# Patient Record
Sex: Female | Born: 1970
Health system: Southern US, Community
[De-identification: ages and names within clinical notes are randomized; demographics above are authoritative.]

## PROBLEM LIST (undated history)

## (undated) DIAGNOSIS — B019 Varicella without complication: Secondary | ICD-10-CM

## (undated) DIAGNOSIS — G43909 Migraine, unspecified, not intractable, without status migrainosus: Secondary | ICD-10-CM

## (undated) HISTORY — PX: AUGMENTATION MAMMAPLASTY: SUR837

## (undated) HISTORY — PX: MASTOPEXY: SUR857

## (undated) HISTORY — DX: Migraine, unspecified, not intractable, without status migrainosus: G43.909

## (undated) HISTORY — DX: Varicella without complication: B01.9

---
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
## 2003-07-21 HISTORY — PX: ABDOMINAL HYSTERECTOMY: SHX81

## 2006-07-22 LAB — HIV ANTIBODY (ROUTINE TESTING W REFLEX): HIV 1&2 Ab, 4th Generation: NEGATIVE

## 2015-01-24 ENCOUNTER — Encounter (INDEPENDENT_AMBULATORY_CARE_PROVIDER_SITE_OTHER): Payer: Self-pay

## 2015-01-24 ENCOUNTER — Encounter: Payer: Self-pay | Admitting: Internal Medicine

## 2015-01-24 ENCOUNTER — Ambulatory Visit (INDEPENDENT_AMBULATORY_CARE_PROVIDER_SITE_OTHER): Payer: BLUE CROSS/BLUE SHIELD | Admitting: Internal Medicine

## 2015-01-24 VITALS — BP 102/68 | HR 71 | Temp 98.1°F | Ht 65.25 in | Wt 134.0 lb

## 2015-01-24 DIAGNOSIS — G43019 Migraine without aura, intractable, without status migrainosus: Secondary | ICD-10-CM | POA: Diagnosis not present

## 2015-01-24 MED ORDER — TOPIRAMATE 50 MG PO TABS: 50.0000 mg | ORAL_TABLET | Freq: Two times a day (BID) | ORAL | Status: DC

## 2015-01-24 MED ORDER — RIZATRIPTAN BENZOATE 10 MG PO TABS: 10.0000 mg | ORAL_TABLET | Freq: Three times a day (TID) | ORAL | Status: DC | PRN

## 2015-01-24 NOTE — Progress Notes (Signed)
HPI  Pt presents to the clinic today to establish care and for management of the conditions listed below. She is transferring care from her PCP in [place]. She moved her 9 months ago.  Migraines: She may not have 1 per month but the next month she could have 3. She has determined that they are triggered by smells. They usually occur over her right eye, and the pain radiates to the back of her head.  She has sensitivity to light, sound and smell. She does have some nausea but denies vomiting. She goes and lays in a dark room which seems to help.  She takes Topomax twice daily and Maxalt as needed. She does think she needs to increase her Topomax as she feels like it is not effective as it once was. She will also take Ibuprofen and Tylenol if she feels a headache coming on. She reports she has had a MRI in the past. She will need refills of her medications today.  Past Medical History  Diagnosis Date  . Migraines   . Chicken pox     Current Outpatient Prescriptions  Medication Sig Dispense Refill  . acetaminophen (TYLENOL) 500 MG tablet Take 500 mg by mouth as needed for headache.    . ibuprofen (ADVIL,MOTRIN) 200 MG tablet Take 800 mg by mouth as needed for headache.     . Probiotic Product (PROBIOTIC ADVANCED) CAPS Take 1 capsule by mouth daily.    . rizatriptan (MAXALT) 10 MG tablet Take 10 mg by mouth 3 (three) times daily as needed for migraine.     . topiramate (TOPAMAX) 25 MG capsule Take 25 mg by mouth 2 (two) times daily.     No current facility-administered medications for this visit.    Allergies  Allergen Reactions  . Morphine And Related Other (See Comments)    Many years ago--does not remember     Family History  Problem Relation Age of Onset  . Hypertension Mother   . Arthritis Father   . Hypertension Maternal Uncle   . Hypertension Maternal Grandmother   . Hypertension Maternal Grandfather     History   Social History  . Marital Status: Married    Spouse  Name: N/A  . Number of Children: N/A  . Years of Education: N/A   Occupational History  . Not on file.   Social History Main Topics  . Smoking status: Current Some Day Smoker  . Smokeless tobacco: Never Used     Comment: quit cigarettes 2014--now using e cigs  . Alcohol Use: 0.0 oz/week    0 Standard drinks or equivalent per week     Comment: rare  . Drug Use: Not on file  . Sexual Activity: Not on file   Other Topics Concern  . Not on file   Social History Narrative  . No narrative on file    ROS:  Constitutional: Denies fever, malaise, fatigue, headache or abrupt weight changes.  HEENT: Denies eye pain, eye redness, ear pain, ringing in the ears, wax buildup, runny nose, nasal congestion, bloody nose, or sore throat. Respiratory: Denies difficulty breathing, shortness of breath, cough or sputum production.   Cardiovascular: Denies chest pain, chest tightness, palpitations or swelling in the hands or feet.  Gastrointestinal: Denies abdominal pain, bloating, constipation, diarrhea or blood in the stool.  GU: Denies frequency, urgency, pain with urination, blood in urine, odor or discharge. Musculoskeletal: Denies decrease in range of motion, difficulty with gait, muscle pain or joint pain and  swelling.  Skin: Denies redness, rashes, lesions or ulcercations.  Neurological: Denies dizziness, difficulty with memory, difficulty with speech or problems with balance and coordination.  Psych: Denies anxiety, depression, SI/HI.  No other specific complaints in a complete review of systems (except as listed in HPI above).  PE:  BP 102/68 mmHg  Pulse 71  Temp(Src) 98.1 F (36.7 C) (Oral)  Ht 5' 5.25" (1.657 m)  Wt 134 lb (60.782 kg)  BMI 22.14 kg/m2  SpO2 99% Wt Readings from Last 3 Encounters:  01/24/15 134 lb (60.782 kg)    General: Appears her stated age, well developed, well nourished in NAD. HEENT: Head: normal shape and size; Eyes: sclera white, no icterus,  conjunctiva pink, PERRLA and EOMs intact;  Cardiovascular: Normal rate and rhythm. S1,S2 noted.  No murmur, rubs or gallops noted.  Pulmonary/Chest: Normal effort and positive vesicular breath sounds. No respiratory distress. No wheezes, rales or ronchi noted.  Neurological: Alert and oriented.  Coordination normal.  Psychiatric: Mood and affect normal. Behavior is normal. Judgment and thought content normal.   Assessment and Plan:

## 2015-01-24 NOTE — Assessment & Plan Note (Signed)
Will increase Topamax to 50 mg BID- sedation caution given. New RX provided today Maxalt refilled Continue Tylenol and Ibuprofen as needed

## 2015-01-24 NOTE — Patient Instructions (Signed)

## 2015-01-24 NOTE — Progress Notes (Signed)
Pre visit review using our clinic review tool, if applicable. No additional management support is needed unless otherwise documented below in the visit note. 

## 2015-04-23 ENCOUNTER — Other Ambulatory Visit: Payer: Self-pay

## 2015-04-23 DIAGNOSIS — G43019 Migraine without aura, intractable, without status migrainosus: Secondary | ICD-10-CM

## 2015-04-23 MED ORDER — TOPIRAMATE 50 MG PO TABS: 50.0000 mg | ORAL_TABLET | Freq: Two times a day (BID) | ORAL | Status: DC

## 2015-05-31 ENCOUNTER — Encounter: Payer: Self-pay | Admitting: Internal Medicine

## 2015-05-31 ENCOUNTER — Ambulatory Visit (INDEPENDENT_AMBULATORY_CARE_PROVIDER_SITE_OTHER): Payer: BLUE CROSS/BLUE SHIELD | Admitting: Internal Medicine

## 2015-05-31 VITALS — BP 106/72 | HR 90 | Temp 98.9°F | Wt 129.5 lb

## 2015-05-31 DIAGNOSIS — G43019 Migraine without aura, intractable, without status migrainosus: Secondary | ICD-10-CM | POA: Diagnosis not present

## 2015-05-31 MED ORDER — TOPIRAMATE 50 MG PO TABS: 50.0000 mg | ORAL_TABLET | Freq: Two times a day (BID) | ORAL | Status: DC

## 2015-05-31 NOTE — Assessment & Plan Note (Signed)
Improved on Topamax 50 mg BID Takes Maxalt as needed Topamax refilled today  Make an appt in 6 months for annual exam

## 2015-05-31 NOTE — Patient Instructions (Signed)
Recurrent Migraine Headache A migraine headache is an intense, throbbing pain on one or both sides of your head. Recurrent migraines keep coming back. A migraine can last for 30 minutes to several hours. CAUSES  The exact cause of a migraine headache is not always known. However, a migraine may be caused when nerves in the brain become irritated and release chemicals that cause inflammation. This causes pain. Certain things may also trigger migraines, such as:   Alcohol.  Smoking.  Stress.  Menstruation.  Aged cheeses.  Foods or drinks that contain nitrates, glutamate, aspartame, or tyramine.  Lack of sleep.  Chocolate.  Caffeine.  Hunger.  Physical exertion.  Fatigue.  Medicines used to treat chest pain (nitroglycerine), birth control pills, estrogen, and some blood pressure medicines. SYMPTOMS   Pain on one or both sides of your head.  Pulsating or throbbing pain.  Severe pain that prevents daily activities.  Pain that is aggravated by any physical activity.  Nausea, vomiting, or both.  Dizziness.  Pain with exposure to bright lights, loud noises, or activity.  General sensitivity to bright lights, loud noises, or smells. Before you get a migraine, you may get warning signs that a migraine is coming (aura). An aura may include:  Seeing flashing lights.  Seeing bright spots, halos, or zigzag lines.  Having tunnel vision or blurred vision.  Having feelings of numbness or tingling.  Having trouble talking.  Having muscle weakness. DIAGNOSIS  A recurrent migraine headache is often diagnosed based on:  Symptoms.  Physical examination.  A CT scan or MRI of your head. These imaging tests cannot diagnose migraines but can help rule out other causes of headaches.  TREATMENT  Medicines may be given for pain and nausea. Medicines can also be given to help prevent recurrent migraines. HOME CARE INSTRUCTIONS  Only take over-the-counter or prescription  medicines for pain or discomfort as directed by your health care provider. The use of long-term narcotics is not recommended.  Lie down in a dark, quiet room when you have a migraine.  Keep a journal to find out what may trigger your migraine headaches. For example, write down:  What you eat and drink.  How much sleep you get.  Any change to your diet or medicines.  Limit alcohol consumption.  Quit smoking if you smoke.  Get 7-9 hours of sleep, or as recommended by your health care provider.  Limit stress.  Keep lights dim if bright lights bother you and make your migraines worse. SEEK MEDICAL CARE IF:   You do not get relief from the medicines given to you.  You have a recurrence of pain.  You have a fever. SEEK IMMEDIATE MEDICAL CARE IF:  Your migraine becomes severe.  You have a stiff neck.  You have loss of vision.  You have muscular weakness or loss of muscle control.  You start losing your balance or have trouble walking.  You feel faint or pass out.  You have severe symptoms that are different from your first symptoms. MAKE SURE YOU:   Understand these instructions.  Will watch your condition.  Will get help right away if you are not doing well or get worse.   This information is not intended to replace advice given to you by your health care provider. Make sure you discuss any questions you have with your health care provider.   Document Released: 03/31/2001 Document Revised: 07/27/2014 Document Reviewed: 03/13/2013 Elsevier Interactive Patient Education 2016 [employer]  

## 2015-05-31 NOTE — Progress Notes (Signed)
Pre visit review using our clinic review tool, if applicable. No additional management support is needed unless otherwise documented below in the visit note. 

## 2015-05-31 NOTE — Progress Notes (Signed)
HPI  Pt presents to the clinic today to followup migraines.  Migraines: She may not have 1 per month but the next month she could have 3. She has determined that they are triggered by smells. They usually occur over her right eye, and the pain radiates to the back of her head.  She has sensitivity to light, sound and smell. She does have some nausea but denies vomiting. She goes and lays in a dark room which seems to help.  We recently increased her Topomax to 50 mg twice daily and Maxalt as needed. She will also take Ibuprofen and Tylenol if she feels a headache coming on. She reports she has had a MRI in the past. She will need refills of her medications today.  Past Medical History  Diagnosis Date  . Migraines   . Chicken pox     Current Outpatient Prescriptions  Medication Sig Dispense Refill  . acetaminophen (TYLENOL) 500 MG tablet Take 500 mg by mouth as needed for headache.    . ibuprofen (ADVIL,MOTRIN) 200 MG tablet Take 800 mg by mouth as needed for headache.     . rizatriptan (MAXALT) 10 MG tablet Take 1 tablet (10 mg total) by mouth 3 (three) times daily as needed for migraine. 10 tablet 5  . topiramate (TOPAMAX) 50 MG tablet Take 1 tablet (50 mg total) by mouth 2 (two) times daily. 180 tablet 0  . Probiotic Product (PROBIOTIC ADVANCED) CAPS Take 1 capsule by mouth daily.     No current facility-administered medications for this visit.    Allergies  Allergen Reactions  . Morphine And Related Other (See Comments)    Many years ago--does not remember     Family History  Problem Relation Age of Onset  . Hypertension Mother   . Diabetes Mother   . Arthritis Father   . Hypertension Maternal Uncle   . Hypertension Maternal Grandmother   . Hypertension Maternal Grandfather   . Cancer Paternal Grandfather     Social History   Social History  . Marital Status: Married    Spouse Name: N/A  . Number of Children: N/A  . Years of Education: N/A   Occupational History   . Not on file.   Social History Main Topics  . Smoking status: Current Some Day Smoker  . Smokeless tobacco: Never Used     Comment: quit cigarettes 2014--now using e cigs  . Alcohol Use: 0.0 oz/week    0 Standard drinks or equivalent per week     Comment: rare  . Drug Use: Not on file  . Sexual Activity: Yes   Other Topics Concern  . Not on file   Social History Narrative    ROS:  Constitutional: Denies fever, malaise, fatigue, headache or abrupt weight changes.  HEENT: Denies eye pain, eye redness, ear pain, ringing in the ears, wax buildup, runny nose, nasal congestion, bloody nose, or sore throat. Respiratory: Denies difficulty breathing, shortness of breath, cough or sputum production.   Cardiovascular: Denies chest pain, chest tightness, palpitations or swelling in the hands or feet.  Gastrointestinal: Denies abdominal pain, bloating, constipation, diarrhea or blood in the stool.  GU: Denies frequency, urgency, pain with urination, blood in urine, odor or discharge. Musculoskeletal: Denies decrease in range of motion, difficulty with gait, muscle pain or joint pain and swelling.  Skin: Denies redness, rashes, lesions or ulcercations.  Neurological: Denies dizziness, difficulty with memory, difficulty with speech or problems with balance and coordination.  Psych: Denies anxiety,  depression, SI/HI.  No other specific complaints in a complete review of systems (except as listed in HPI above).  PE:  BP 106/72 mmHg  Pulse 90  Temp(Src) 98.9 F (37.2 C) (Oral)  Wt 129 lb 8 oz (58.741 kg)  SpO2 98% Wt Readings from Last 3 Encounters:  05/31/15 129 lb 8 oz (58.741 kg)  01/24/15 134 lb (60.782 kg)    General: Appears her stated age, well developed, well nourished in NAD. HEENT: Head: normal shape and size; Eyes: sclera white, no icterus, conjunctiva pink, PERRLA and EOMs intact;  Cardiovascular: Normal rate and rhythm. S1,S2 noted.  No murmur, rubs or gallops noted.   Pulmonary/Chest: Normal effort and positive vesicular breath sounds. No respiratory distress. No wheezes, rales or ronchi noted.  Neurological: Alert and oriented.  Coordination normal.  Psychiatric: Mood and affect normal. Behavior is normal. Judgment and thought content normal.   Assessment and Plan:

## 2015-07-23 ENCOUNTER — Ambulatory Visit (INDEPENDENT_AMBULATORY_CARE_PROVIDER_SITE_OTHER): Payer: BLUE CROSS/BLUE SHIELD | Admitting: Internal Medicine

## 2015-07-23 ENCOUNTER — Encounter: Payer: Self-pay | Admitting: Internal Medicine

## 2015-07-23 VITALS — BP 104/74 | HR 86 | Temp 98.4°F | Wt 128.0 lb

## 2015-07-23 DIAGNOSIS — J069 Acute upper respiratory infection, unspecified: Secondary | ICD-10-CM | POA: Diagnosis not present

## 2015-07-23 DIAGNOSIS — T3695XA Adverse effect of unspecified systemic antibiotic, initial encounter: Secondary | ICD-10-CM

## 2015-07-23 DIAGNOSIS — J029 Acute pharyngitis, unspecified: Secondary | ICD-10-CM

## 2015-07-23 DIAGNOSIS — B379 Candidiasis, unspecified: Secondary | ICD-10-CM

## 2015-07-23 MED ORDER — AMOXICILLIN 875 MG PO TABS: 875.0000 mg | ORAL_TABLET | Freq: Two times a day (BID) | ORAL | Status: DC

## 2015-07-23 MED ORDER — FLUCONAZOLE 150 MG PO TABS
150.0000 mg | ORAL_TABLET | Freq: Once | ORAL | Status: DC
Start: 2015-07-23 — End: 2015-10-30

## 2015-07-23 NOTE — Progress Notes (Signed)
HPI  Pt presents to the clinic today with c/o headache, nasal congestion, ear fullness and sore throat. This started 1 week ago. She is not blowing anything out of her nose. She did have difficulty swallowing in the beginning. She denies fever but has had chills and body aches. She has tried Mucinex, Dayquil, Sudafed and nasal Saline with minimal relief. She has no history of allergies or breathing problems. She has had sick contacts. She does not smoke.  Review of Systems    Past Medical History  Diagnosis Date  . Migraines   . Chicken pox     Family History  Problem Relation Age of Onset  . Hypertension Mother   . Diabetes Mother   . Arthritis Father   . Hypertension Maternal Uncle   . Hypertension Maternal Grandmother   . Hypertension Maternal Grandfather   . Cancer Paternal Grandfather     Social History   Social History  . Marital Status: Married    Spouse Name: N/A  . Number of Children: N/A  . Years of Education: N/A   Occupational History  . Not on file.   Social History Main Topics  . Smoking status: Current Some Day Smoker  . Smokeless tobacco: Never Used     Comment: quit cigarettes 2014--now using e cigs  . Alcohol Use: 0.0 oz/week    0 Standard drinks or equivalent per week     Comment: rare  . Drug Use: Not on file  . Sexual Activity: Yes   Other Topics Concern  . Not on file   Social History Narrative    Allergies  Allergen Reactions  . Morphine And Related Other (See Comments)    Many years ago--does not remember      Constitutional: Positive headache, fatigueDenies fever or abrupt weight changes.  HEENT:  Positive nasal congestion, ear fullness and sore throat. Denies eye redness, ear pain, ringing in the ears, wax buildup, runny nose or bloody nose. Respiratory: Denies cough, difficulty breathing or shortness of breath.  Cardiovascular: Denies chest pain, chest tightness, palpitations or swelling in the hands or feet.   No other  specific complaints in a complete review of systems (except as listed in HPI above).  Objective:  BP 104/74 mmHg  Pulse 86  Temp(Src) 98.4 F (36.9 C) (Oral)  Wt 128 lb (58.06 kg)  SpO2 98%  General: Appears her stated age, ill appearing in NAD. HEENT: Head: normal shape and size, no sinus tenderness noted; Eyes: sclera white, no icterus, conjunctiva pink; Ears: Tm's gray and intact, normal light reflex; Nose: mucosa boggy and moist, septum midline; Throat/Mouth: + PND. Teeth present, mucosa erythematous and moist, no exudate noted, no lesions or ulcerations noted.  Neck:  Cervical adenopathy noted R>L.  Cardiovascular: Normal rate and rhythm. S1,S2 noted.  No murmur, rubs or gallops noted.  Pulmonary/Chest: Normal effort and positive vesicular breath sounds. No respiratory distress. No wheezes, rales or ronchi noted.      Assessment & Plan:     Upper Respiratory Infection:  RST: negative Get some rest and drink plenty of water Do salt water gargles for the sore throat Ibuprofen as needed for body aches/sore throat eRx for Amoxil BID x 10 days eRx for Diflucan for antibiotic induced infection  RTC as needed or if symptoms persist.

## 2015-07-23 NOTE — Progress Notes (Signed)
Pre visit review using our clinic review tool, if applicable. No additional management support is needed unless otherwise documented below in the visit note. 

## 2015-07-23 NOTE — Patient Instructions (Signed)

## 2015-10-17 ENCOUNTER — Other Ambulatory Visit: Payer: Self-pay | Admitting: Internal Medicine

## 2015-10-30 ENCOUNTER — Ambulatory Visit (INDEPENDENT_AMBULATORY_CARE_PROVIDER_SITE_OTHER): Payer: BLUE CROSS/BLUE SHIELD | Admitting: Internal Medicine

## 2015-10-30 ENCOUNTER — Encounter: Payer: Self-pay | Admitting: Internal Medicine

## 2015-10-30 ENCOUNTER — Ambulatory Visit (INDEPENDENT_AMBULATORY_CARE_PROVIDER_SITE_OTHER)
Admission: RE | Admit: 2015-10-30 | Discharge: 2015-10-30 | Disposition: A | Discharge status: Home / Self Care | Payer: BLUE CROSS/BLUE SHIELD | Source: Ambulatory Visit | Attending: Internal Medicine | Admitting: Internal Medicine

## 2015-10-30 VITALS — BP 126/84 | HR 69 | Temp 98.4°F | Ht 64.75 in | Wt 131.2 lb

## 2015-10-30 DIAGNOSIS — M545 Low back pain, unspecified: Secondary | ICD-10-CM

## 2015-10-30 DIAGNOSIS — M47816 Spondylosis without myelopathy or radiculopathy, lumbar region: Secondary | ICD-10-CM | POA: Diagnosis not present

## 2015-10-30 DIAGNOSIS — G43019 Migraine without aura, intractable, without status migrainosus: Secondary | ICD-10-CM | POA: Diagnosis not present

## 2015-10-30 DIAGNOSIS — Z0000 Encounter for general adult medical examination without abnormal findings: Secondary | ICD-10-CM

## 2015-10-30 DIAGNOSIS — Z0001 Encounter for general adult medical examination with abnormal findings: Secondary | ICD-10-CM

## 2015-10-30 MED ORDER — TOPIRAMATE 50 MG PO TABS: ORAL_TABLET | ORAL | Status: DC

## 2015-10-30 MED ORDER — RIZATRIPTAN BENZOATE 10 MG PO TABS: 10.0000 mg | ORAL_TABLET | Freq: Three times a day (TID) | ORAL | Status: DC | PRN

## 2015-10-30 NOTE — Progress Notes (Signed)
Subjective:    Patient ID: Emily Dalton, female    DOB: 1970-08-29, 45 y.o.   MRN: [id number]  HPI  She presents to the clinic today for her annual exam.  Flu: never Tetanus: 2012 Pap Smear: ? 2015 Mammogram:  2012 Vision Screening: yearly Dentist: annually  Diet: She does eat meat. She consumes fruits and veggies a few days per week. She does consume some fried foods. She drinks mostly water and coffee.  Exercise: She is not exercising but she is renovating a house which is very physical for her.  She needs refills today of her Topamax and Maxalt. She feels like her migraines are well controlled with this regimen. She only gets a migraine once every 3 months.  She c/o low back pain just around her tailbone. This started 3 weeks ago. She moved wrong and felt something catch. She describes the pain as burning, tingling and numbness. The pain does not radiate. She does have some associated muscle tightness. She denies loss of bowel or bladder. She has tried Biofreeze with some relief.  Review of Systems      Past Medical History  Diagnosis Date  . Migraines   . Chicken pox     Current Outpatient Prescriptions  Medication Sig Dispense Refill  . acetaminophen (TYLENOL) 500 MG tablet Take 500 mg by mouth as needed for headache.    . ibuprofen (ADVIL,MOTRIN) 200 MG tablet Take 800 mg by mouth as needed for headache.     . rizatriptan (MAXALT) 10 MG tablet Take 1 tablet (10 mg total) by mouth 3 (three) times daily as needed for migraine. 10 tablet 5  . topiramate (TOPAMAX) 50 MG tablet TAKE 1 TABLET (50 MG TOTAL) BY MOUTH 2 (TWO) TIMES DAILY. 180 tablet 0   No current facility-administered medications for this visit.    Allergies  Allergen Reactions  . Morphine And Related Other (See Comments)    Many years ago--does not remember     Family History  Problem Relation Age of Onset  . Hypertension Mother   . Diabetes Mother   . Arthritis Father   . Hypertension Maternal  Uncle   . Hypertension Maternal Grandmother   . Hypertension Maternal Grandfather   . Cancer Paternal Grandfather     Social History   Social History  . Marital Status: Married    Spouse Name: N/A  . Number of Children: N/A  . Years of Education: N/A   Occupational History  . Not on file.   Social History Main Topics  . Smoking status: Current Some Day Smoker  . Smokeless tobacco: Never Used     Comment: quit cigarettes 2014--now using e cigs  . Alcohol Use: 0.0 oz/week    0 Standard drinks or equivalent per week     Comment: rare  . Drug Use: No  . Sexual Activity: Yes   Other Topics Concern  . Not on file   Social History Narrative     Constitutional: Denies fever, malaise, fatigue, headache or abrupt weight changes.  HEENT: Denies eye pain, eye redness, ear pain, ringing in the ears, wax buildup, runny nose, nasal congestion, bloody nose, or sore throat. Respiratory: Denies difficulty breathing, shortness of breath, cough or sputum production.   Cardiovascular: Denies chest pain, chest tightness, palpitations or swelling in the hands or feet.  Gastrointestinal: Denies abdominal pain, bloating, constipation, diarrhea or blood in the stool.  GU: Denies urgency, frequency, pain with urination, burning sensation, blood in urine, odor or  discharge. Musculoskeletal: Pt reports back pain. Denies decrease in range of motion, difficulty with gait, or joint swelling.  Skin: Denies redness, rashes, lesions or ulcercations.  Neurological: Denies dizziness, difficulty with memory, difficulty with speech or problems with balance and coordination.  Psych: Denies anxiety, depression, SI/HI.  No other specific complaints in a complete review of systems (except as listed in HPI above).  Objective:   Physical Exam   BP 126/84 mmHg  Pulse 69  Temp(Src) 98.4 F (36.9 C) (Oral)  Ht 5' 4.75" (1.645 m)  Wt 131 lb 4 oz (59.535 kg)  BMI 22.00 kg/m2  SpO2 99% Wt Readings from Last  3 Encounters:  10/30/15 131 lb 4 oz (59.535 kg)  07/23/15 128 lb (58.06 kg)  05/31/15 129 lb 8 oz (58.741 kg)    General: Appears her stated age, well developed, well nourished in NAD. Skin: Warm, dry and intact.  HEENT: Head: normal shape and size; Eyes: sclera white, no icterus, conjunctiva pink, PERRLA and EOMs intact; Ears: Tm's gray and intact, normal light reflex; Throat/Mouth: Teeth present, mucosa pink and moist, no exudate, lesions or ulcerations noted.  Neck:  Neck supple, trachea midline. No masses, lumps or thyromegaly present.  Cardiovascular: Normal rate and rhythm. S1,S2 noted.  No murmur, rubs or gallops noted. No JVD or BLE edema.  Pulmonary/Chest: Normal effort and positive vesicular breath sounds. No respiratory distress. No wheezes, rales or ronchi noted.  Abdomen: Soft and nontender. Normal bowel sounds. No distention or masses noted. Liver, spleen and kidneys non palpable. Musculoskeletal: Normal flexion, extension and rotation of the spine. Pain with palpation over the sacrum. Strength 5/5 BUE/BLE. No difficulty with gait.  Neurological: Alert and oriented. Cranial nerves II-XII grossly  intact. Coordination normal.  Psychiatric: Mood and affect normal. Behavior is normal. Judgment and thought content normal.        Assessment & Plan:   Preventative Health Maintenance:  She declines flu shot Tetanus UTD Pap Smear due 2020 Mammogram declined at this time Encouraged her to consume a balanced diet and start an exercise regimen Encouraged her to see an eye doctor and dentist at least annually Will check CBC, CMET, Lipid today  Migraines:  Refilled Topamax and Maxalt  Low back pain:  Likely strain Xray lumbar spine today Ibuprofen/Biofreeze as needed  RTC in 1 year, sooner if needed

## 2015-10-30 NOTE — Patient Instructions (Signed)
Health Maintenance, Female Adopting a healthy lifestyle and getting preventive care can go a long way to promote health and wellness. Talk with your health care provider about what schedule of regular examinations is right for you. This is a good chance for you to check in with your provider about disease prevention and staying healthy. In between checkups, there are plenty of things you can do on your own. Experts have done a lot of research about which lifestyle changes and preventive measures are most likely to keep you healthy. Ask your health care provider for more information. WEIGHT AND DIET  Eat a healthy diet  Be sure to include plenty of vegetables, fruits, low-fat dairy products, and lean protein.  Do not eat a lot of foods high in solid fats, added sugars, or salt.  Get regular exercise. This is one of the most important things you can do for your health.  Most adults should exercise for at least 150 minutes each week. The exercise should increase your heart rate and make you sweat (moderate-intensity exercise).  Most adults should also do strengthening exercises at least twice a week. This is in addition to the moderate-intensity exercise.  Maintain a healthy weight  Body mass index (BMI) is a measurement that can be used to identify possible weight problems. It estimates body fat based on height and weight. Your health care provider can help determine your BMI and help you achieve or maintain a healthy weight.  For females 20 years of age and older:   A BMI below 18.5 is considered underweight.  A BMI of 18.5 to 24.9 is normal.  A BMI of 25 to 29.9 is considered overweight.  A BMI of 30 and above is considered obese.  Watch levels of cholesterol and blood lipids  You should start having your blood tested for lipids and cholesterol at 45 years of age, then have this test every 5 years.  You may need to have your cholesterol levels checked more often if:  Your lipid  or cholesterol levels are high.  You are older than 45 years of age.  You are at high risk for heart disease.  CANCER SCREENING   Lung Cancer  Lung cancer screening is recommended for adults 55-80 years old who are at high risk for lung cancer because of a history of smoking.  A yearly low-dose CT scan of the lungs is recommended for people who:  Currently smoke.  Have quit within the past 15 years.  Have at least a 30-pack-year history of smoking. A pack year is smoking an average of one pack of cigarettes a day for 1 year.  Yearly screening should continue until it has been 15 years since you quit.  Yearly screening should stop if you develop a health problem that would prevent you from having lung cancer treatment.  Breast Cancer  Practice breast self-awareness. This means understanding how your breasts normally appear and feel.  It also means doing regular breast self-exams. Let your health care provider know about any changes, no matter how small.  If you are in your 20s or 30s, you should have a clinical breast exam (CBE) by a health care provider every 1-3 years as part of a regular health exam.  If you are 40 or older, have a CBE every year. Also consider having a breast X-ray (mammogram) every year.  If you have a family history of breast cancer, talk to your health care provider about genetic screening.  If you   are at high risk for breast cancer, talk to your health care provider about having an MRI and a mammogram every year.  Breast cancer gene (BRCA) assessment is recommended for women who have family members with BRCA-related cancers. BRCA-related cancers include:  Breast.  Ovarian.  Tubal.  Peritoneal cancers.  Results of the assessment will determine the need for genetic counseling and BRCA1 and BRCA2 testing. Cervical Cancer Your health care provider may recommend that you be screened regularly for cancer of the pelvic organs (ovaries, uterus, and  vagina). This screening involves a pelvic examination, including checking for microscopic changes to the surface of your cervix (Pap test). You may be encouraged to have this screening done every 3 years, beginning at age 21.  For women ages 30-65, health care providers may recommend pelvic exams and Pap testing every 3 years, or they may recommend the Pap and pelvic exam, combined with testing for human papilloma virus (HPV), every 5 years. Some types of HPV increase your risk of cervical cancer. Testing for HPV may also be done on women of any age with unclear Pap test results.  Other health care providers may not recommend any screening for nonpregnant women who are considered low risk for pelvic cancer and who do not have symptoms. Ask your health care provider if a screening pelvic exam is right for you.  If you have had past treatment for cervical cancer or a condition that could lead to cancer, you need Pap tests and screening for cancer for at least 20 years after your treatment. If Pap tests have been discontinued, your risk factors (such as having a new sexual partner) need to be reassessed to determine if screening should resume. Some women have medical problems that increase the chance of getting cervical cancer. In these cases, your health care provider may recommend more frequent screening and Pap tests. Colorectal Cancer  This type of cancer can be detected and often prevented.  Routine colorectal cancer screening usually begins at 45 years of age and continues through 45 years of age.  Your health care provider may recommend screening at an earlier age if you have risk factors for colon cancer.  Your health care provider may also recommend using home test kits to check for hidden blood in the stool.  A small camera at the end of a tube can be used to examine your colon directly (sigmoidoscopy or colonoscopy). This is done to check for the earliest forms of colorectal  cancer.  Routine screening usually begins at age 50.  Direct examination of the colon should be repeated every 5-10 years through 45 years of age. However, you may need to be screened more often if early forms of precancerous polyps or small growths are found. Skin Cancer  Check your skin from head to toe regularly.  Tell your health care provider about any new moles or changes in moles, especially if there is a change in a mole's shape or color.  Also tell your health care provider if you have a mole that is larger than the size of a pencil eraser.  Always use sunscreen. Apply sunscreen liberally and repeatedly throughout the day.  Protect yourself by wearing long sleeves, pants, a wide-brimmed hat, and sunglasses whenever you are outside. HEART DISEASE, DIABETES, AND HIGH BLOOD PRESSURE   High blood pressure causes heart disease and increases the risk of stroke. High blood pressure is more likely to develop in:  People who have blood pressure in the high end   of the normal range (130-139/85-89 mm Hg).  People who are overweight or obese.  People who are African American.  If you are 38-23 years of age, have your blood pressure checked every 3-5 years. If you are 61 years of age or older, have your blood pressure checked every year. You should have your blood pressure measured twice--once when you are at a hospital or clinic, and once when you are not at a hospital or clinic. Record the average of the two measurements. To check your blood pressure when you are not at a hospital or clinic, you can use:  An automated blood pressure machine at a pharmacy.  A home blood pressure monitor.  If you are between 45 years and 39 years old, ask your health care provider if you should take aspirin to prevent strokes.  Have regular diabetes screenings. This involves taking a blood sample to check your fasting blood sugar level.  If you are at a normal weight and have a low risk for diabetes,  have this test once every three years after 45 years of age.  If you are overweight and have a high risk for diabetes, consider being tested at a younger age or more often. PREVENTING INFECTION  Hepatitis B  If you have a higher risk for hepatitis B, you should be screened for this virus. You are considered at high risk for hepatitis B if:  You were born in a country where hepatitis B is common. Ask your health care provider which countries are considered high risk.  Your parents were born in a high-risk country, and you have not been immunized against hepatitis B (hepatitis B vaccine).  You have HIV or AIDS.  You use needles to inject street drugs.  You live with someone who has hepatitis B.  You have had sex with someone who has hepatitis B.  You get hemodialysis treatment.  You take certain medicines for conditions, including cancer, organ transplantation, and autoimmune conditions. Hepatitis C  Blood testing is recommended for:  Everyone born from [birth year] through 1965.  Anyone with known risk factors for hepatitis C. Sexually transmitted infections (STIs)  You should be screened for sexually transmitted infections (STIs) including gonorrhea and chlamydia if:  You are sexually active and are younger than 45 years of age.  You are older than 45 years of age and your health care provider tells you that you are at risk for this type of infection.  Your sexual activity has changed since you were last screened and you are at an increased risk for chlamydia or gonorrhea. Ask your health care provider if you are at risk.  If you do not have HIV, but are at risk, it may be recommended that you take a prescription medicine daily to prevent HIV infection. This is called pre-exposure prophylaxis (PrEP). You are considered at risk if:  You are sexually active and do not regularly use condoms or know the HIV status of your partner(s).  You take drugs by injection.  You are sexually  active with a partner who has HIV. Talk with your health care provider about whether you are at high risk of being infected with HIV. If you choose to begin PrEP, you should first be tested for HIV. You should then be tested every 3 months for as long as you are taking PrEP.  PREGNANCY   If you are premenopausal and you may become pregnant, ask your health care provider about preconception counseling.  If you may  become pregnant, take 400 to 800 micrograms (mcg) of folic acid every day.  If you want to prevent pregnancy, talk to your health care provider about birth control (contraception). OSTEOPOROSIS AND MENOPAUSE   Osteoporosis is a disease in which the bones lose minerals and strength with aging. This can result in serious bone fractures. Your risk for osteoporosis can be identified using a bone density scan.  If you are 61 years of age or older, or if you are at risk for osteoporosis and fractures, ask your health care provider if you should be screened.  Ask your health care provider whether you should take a calcium or vitamin D supplement to lower your risk for osteoporosis.  Menopause may have certain physical symptoms and risks.  Hormone replacement therapy may reduce some of these symptoms and risks. Talk to your health care provider about whether hormone replacement therapy is right for you.  HOME CARE INSTRUCTIONS   Schedule regular health, dental, and eye exams.  Stay current with your immunizations.   Do not use any tobacco products including cigarettes, chewing tobacco, or electronic cigarettes.  If you are pregnant, do not drink alcohol.  If you are breastfeeding, limit how much and how often you drink alcohol.  Limit alcohol intake to no more than 1 drink per day for nonpregnant women. One drink equals 12 ounces of beer, 5 ounces of wine, or 1 ounces of hard liquor.  Do not use street drugs.  Do not share needles.  Ask your health care provider for help if  you need support or information about quitting drugs.  Tell your health care provider if you often feel depressed.  Tell your health care provider if you have ever been abused or do not feel safe at home.   This information is not intended to replace advice given to you by your health care provider. Make sure you discuss any questions you have with your health care provider.   Document Released: 01/19/2011 Document Revised: 07/27/2014 Document Reviewed: 06/07/2013 Elsevier Interactive Patient Education [employer].

## 2015-10-30 NOTE — Progress Notes (Signed)
Pre visit review using our clinic review tool, if applicable. No additional management support is needed unless otherwise documented below in the visit note. 

## 2015-10-31 ENCOUNTER — Encounter: Payer: Self-pay | Admitting: *Deleted

## 2015-10-31 LAB — COMPREHENSIVE METABOLIC PANEL
ALK PHOS: 49 U/L (ref 39–117)
ALT: 14 U/L (ref 0–35)
AST: 14 U/L (ref 0–37)
Albumin: 4.4 g/dL (ref 3.5–5.2)
BUN: 10 mg/dL (ref 6–23)
CHLORIDE: 106 meq/L (ref 96–112)
CO2: 25 meq/L (ref 19–32)
Calcium: 9.4 mg/dL (ref 8.4–10.5)
Creatinine, Ser: 0.8 mg/dL (ref 0.40–1.20)
GFR: 82.4 mL/min (ref >60.00)
GLUCOSE: 85 mg/dL (ref 70–99)
POTASSIUM: 4.1 meq/L (ref 3.5–5.1)
SODIUM: 137 meq/L (ref 135–145)
TOTAL PROTEIN: 6.9 g/dL (ref 6.0–8.3)
Total Bilirubin: 0.2 mg/dL (ref 0.2–1.2)

## 2015-10-31 LAB — LIPID PANEL
CHOL/HDL RATIO: 3
Cholesterol: 213 mg/dL — ABNORMAL HIGH (ref 0–200)
HDL: 67.7 mg/dL (ref >39.00)
LDL CALC: 130 mg/dL — AB (ref 0–99)
NONHDL: 145.25
Triglycerides: 74 mg/dL (ref 0.0–149.0)
VLDL: 14.8 mg/dL (ref 0.0–40.0)

## 2015-10-31 LAB — CBC
HEMATOCRIT: 40.7 % (ref 36.0–46.0)
HEMOGLOBIN: 13.7 g/dL (ref 12.0–15.0)
MCHC: 33.7 g/dL (ref 30.0–36.0)
MCV: 91.5 fl (ref 78.0–100.0)
Platelets: 316 10*3/uL (ref 150.0–400.0)
RBC: 4.43 Mil/uL (ref 3.87–5.11)
RDW: 13.7 % (ref 11.5–15.5)
WBC: 6.6 10*3/uL (ref 4.0–10.5)

## 2015-11-04 ENCOUNTER — Telehealth: Payer: Self-pay | Admitting: Internal Medicine

## 2015-11-04 NOTE — Telephone Encounter (Signed)
Pt is returning all from last week. Please call back at [phone] Thanks

## 2015-11-04 NOTE — Telephone Encounter (Signed)
Patient returning call for xray results from 4/12.  Results given.

## 2016-08-19 ENCOUNTER — Other Ambulatory Visit: Payer: Self-pay | Admitting: Internal Medicine

## 2016-08-19 DIAGNOSIS — G43019 Migraine without aura, intractable, without status migrainosus: Secondary | ICD-10-CM

## 2016-08-19 NOTE — Telephone Encounter (Signed)
Last filled 10/2015 with 5 refills--please advise

## 2016-11-16 ENCOUNTER — Other Ambulatory Visit: Payer: Self-pay | Admitting: Internal Medicine

## 2016-12-07 ENCOUNTER — Ambulatory Visit (INDEPENDENT_AMBULATORY_CARE_PROVIDER_SITE_OTHER): Payer: BLUE CROSS/BLUE SHIELD | Admitting: Internal Medicine

## 2016-12-07 ENCOUNTER — Other Ambulatory Visit: Payer: Self-pay

## 2016-12-07 ENCOUNTER — Encounter: Payer: Self-pay | Admitting: Internal Medicine

## 2016-12-07 VITALS — BP 106/74 | HR 66 | Temp 98.0°F | Ht 64.75 in | Wt 134.8 lb

## 2016-12-07 DIAGNOSIS — G43019 Migraine without aura, intractable, without status migrainosus: Secondary | ICD-10-CM

## 2016-12-07 DIAGNOSIS — Z0000 Encounter for general adult medical examination without abnormal findings: Secondary | ICD-10-CM

## 2016-12-07 DIAGNOSIS — M7711 Lateral epicondylitis, right elbow: Secondary | ICD-10-CM | POA: Diagnosis not present

## 2016-12-07 DIAGNOSIS — Z1231 Encounter for screening mammogram for malignant neoplasm of breast: Secondary | ICD-10-CM

## 2016-12-07 DIAGNOSIS — Z0001 Encounter for general adult medical examination with abnormal findings: Secondary | ICD-10-CM

## 2016-12-07 DIAGNOSIS — Z1239 Encounter for other screening for malignant neoplasm of breast: Secondary | ICD-10-CM

## 2016-12-07 NOTE — Progress Notes (Signed)
Subjective:    Patient ID: Emily Dalton, female    DOB: [birth year], 46 y.o.   MRN: [id number]  HPI  Pt presents to the clinic today for her annual exam.   She does c/o right elbow pain. This started 3 months ago but has got worse . It seems worse with lifting. She has noticed decreased strength. She denies numbness or tingling in the right arm. She denies any injury to the area. She has tried wearing a tennis elbow brace which has not really helped. She has also tried ice and tried Tylenol without much relief.  Flu: never Tetanus: 10/2010 Mammogram: ? 2015 Pap Smear: ? 2015, partial hysterectomy Vision Screening: yearly Dentist: biannually  Diet: She does eat meat. She consumes some veggies, not a lot of fruit. She tries to avoid fried foods. She drinks mostly coffee and water. Exercise: none  Review of Systems      Past Medical History:  Diagnosis Date  . Chicken pox   . Migraines     Current Outpatient Prescriptions  Medication Sig Dispense Refill  . acetaminophen (TYLENOL) 500 MG tablet Take 500 mg by mouth as needed for headache.    . ibuprofen (ADVIL,MOTRIN) 200 MG tablet Take 800 mg by mouth as needed for headache.     . rizatriptan (MAXALT) 10 MG tablet TAKE 1 TABLET (10 MG TOTAL) BY MOUTH 3 (THREE) TIMES DAILY AS NEEDED FOR MIGRAINE. 10 tablet 4  . topiramate (TOPAMAX) 50 MG tablet Take 1 tablet (50 mg total) by mouth 2 (two) times daily. MUST SCHEDULE ANNUAL EXAM FOR FURTHER REFILLS 180 tablet 0   No current facility-administered medications for this visit.     Allergies  Allergen Reactions  . Morphine And Related Other (See Comments)    Many years ago--does not remember     Family History  Problem Relation Age of Onset  . Hypertension Mother   . Diabetes Mother   . Arthritis Father   . Hypertension Maternal Uncle   . Hypertension Maternal Grandmother   . Hypertension Maternal Grandfather   . Cancer Paternal Grandfather     Social History   Social  History  . Marital status: Married    Spouse name: N/A  . Number of children: N/A  . Years of education: N/A   Occupational History  . Not on file.   Social History Main Topics  . Smoking status: Current Some Day Smoker  . Smokeless tobacco: Never Used     Comment: quit cigarettes 2014--now using e cigs  . Alcohol use 0.0 oz/week     Comment: rare  . Drug use: No  . Sexual activity: Yes   Other Topics Concern  . Not on file   Social History Narrative  . No narrative on file     Constitutional: Denies fever, malaise, fatigue, headache or abrupt weight changes.  HEENT: Denies eye pain, eye redness, ear pain, ringing in the ears, wax buildup, runny nose, nasal congestion, bloody nose, or sore throat. Respiratory: Denies difficulty breathing, shortness of breath, cough or sputum production.   Cardiovascular: Denies chest pain, chest tightness, palpitations or swelling in the hands or feet.  Gastrointestinal: Denies abdominal pain, bloating, constipation, diarrhea or blood in the stool.  GU: Denies urgency, frequency, pain with urination, burning sensation, blood in urine, odor or discharge. Musculoskeletal: Pt reports right elbow pain. Denies decrease in range of motion, difficulty with gait, muscle pain or joint swelling.  Skin: Denies redness, rashes, lesions or ulcercations.  Neurological: Denies dizziness, difficulty with memory, difficulty with speech or problems with balance and coordination.  Psych: Denies anxiety, depression, SI/HI.  No other specific complaints in a complete review of systems (except as listed in HPI above).  Objective:   Physical Exam  BP 106/74   Pulse 66   Temp 98 F (36.7 C) (Oral)   Ht 5' 4.75" (1.645 m)   Wt 134 lb 12 oz (61.1 kg)   SpO2 99%   BMI 22.60 kg/m  Wt Readings from Last 3 Encounters:  12/07/16 134 lb 12 oz (61.1 kg)  10/30/15 131 lb 4 oz (59.5 kg)  07/23/15 128 lb (58.1 kg)    General: Appears her stated age, well  developed, well nourished in NAD. Skin: Warm, dry and intact.  HEENT: Head: normal shape and size; Eyes: sclera white, no icterus, conjunctiva pink, PERRLA and EOMs intact; Ears: Tm's gray and intact, normal light reflex; Throat/Mouth: Teeth present, mucosa pink and moist, no exudate, lesions or ulcerations noted.  Neck:  Neck supple, trachea midline. No masses, lumps or thyromegaly present.  Cardiovascular: Normal rate and rhythm. S1,S2 noted.  No murmur, rubs or gallops noted. No JVD or BLE edema.  Pulmonary/Chest: Normal effort and positive vesicular breath sounds. No respiratory distress. No wheezes, rales or ronchi noted.  Abdomen: Soft and nontender. Normal bowel sounds. No distention or masses noted. Liver, spleen and kidneys non palpable. Musculoskeletal: Normal flexion and extension of the right elbow. Pain with palpation adjacent to the lateral malleolus. No difficulty with gait.  Neurological: Alert and oriented. Cranial nerves II-XII grossly intact. Coordination normal.  Psychiatric: Mood and affect normal. Behavior is normal. Judgment and thought content normal.    BMET    Component Value Date/Time   NA 137 10/30/2015 1612   K 4.1 10/30/2015 1612   CL 106 10/30/2015 1612   CO2 25 10/30/2015 1612   GLUCOSE 85 10/30/2015 1612   BUN 10 10/30/2015 1612   CREATININE 0.80 10/30/2015 1612   CALCIUM 9.4 10/30/2015 1612    Lipid Panel     Component Value Date/Time   CHOL 213 (H) 10/30/2015 1612   TRIG 74.0 10/30/2015 1612   HDL 67.70 10/30/2015 1612   CHOLHDL 3 10/30/2015 1612   VLDL 14.8 10/30/2015 1612   LDLCALC 130 (H) 10/30/2015 1612    CBC    Component Value Date/Time   WBC 6.6 10/30/2015 1612   RBC 4.43 10/30/2015 1612   HGB 13.7 10/30/2015 1612   HCT 40.7 10/30/2015 1612   PLT 316.0 10/30/2015 1612   MCV 91.5 10/30/2015 1612   MCHC 33.7 10/30/2015 1612   RDW 13.7 10/30/2015 1612    Hgb A1C No results found for: HGBA1C          Assessment & Plan:    Preventative Health Maintenance:  Encouraged her to get a flu shot in the fall Tetanus UTD  Pelvic exam due 2020 Mammogram ordered- she will call [facility] to schedule- number provided Encouraged her to consume a balanced diet and exercise regimen Advised her to see an eye doctor and dentist annually Will check CBC, CMET and Lipid profile today  Lateral Epicondylitis:  eRx for Pred Taper x 9 days Continue to wear breast Rest it as much as possible  RTC in 1 year, sooner if needed Webb Silversmith, NP

## 2016-12-07 NOTE — Patient Instructions (Signed)
Health Maintenance, Female Adopting a healthy lifestyle and getting preventive care can go a long way to promote health and wellness. Talk with your health care provider about what schedule of regular examinations is right for you. This is a good chance for you to check in with your provider about disease prevention and staying healthy. In between checkups, there are plenty of things you can do on your own. Experts have done a lot of research about which lifestyle changes and preventive measures are most likely to keep you healthy. Ask your health care provider for more information. Weight and diet Eat a healthy diet  Be sure to include plenty of vegetables, fruits, low-fat dairy products, and lean protein.  Do not eat a lot of foods high in solid fats, added sugars, or salt.  Get regular exercise. This is one of the most important things you can do for your health.  Most adults should exercise for at least 150 minutes each week. The exercise should increase your heart rate and make you sweat (moderate-intensity exercise).  Most adults should also do strengthening exercises at least twice a week. This is in addition to the moderate-intensity exercise. Maintain a healthy weight  Body mass index (BMI) is a measurement that can be used to identify possible weight problems. It estimates body fat based on height and weight. Your health care provider can help determine your BMI and help you achieve or maintain a healthy weight.  For females 76 years of age and older:  A BMI below 18.5 is considered underweight.  A BMI of 18.5 to 24.9 is normal.  A BMI of 25 to 29.9 is considered overweight.  A BMI of 30 and above is considered obese. Watch levels of cholesterol and blood lipids  You should start having your blood tested for lipids and cholesterol at 46 years of age, then have this test every 5 years.  You may need to have your cholesterol levels checked more often if:  Your lipid or  cholesterol levels are high.  You are older than 46 years of age.  You are at high risk for heart disease. Cancer screening Lung Cancer  Lung cancer screening is recommended for adults 64-42 years old who are at high risk for lung cancer because of a history of smoking.  A yearly low-dose CT scan of the lungs is recommended for people who:  Currently smoke.  Have quit within the past 15 years.  Have at least a 30-pack-year history of smoking. A pack year is smoking an average of one pack of cigarettes a day for 1 year.  Yearly screening should continue until it has been 15 years since you quit.  Yearly screening should stop if you develop a health problem that would prevent you from having lung cancer treatment. Breast Cancer  Practice breast self-awareness. This means understanding how your breasts normally appear and feel.  It also means doing regular breast self-exams. Let your health care provider know about any changes, no matter how small.  If you are in your 20s or 30s, you should have a clinical breast exam (CBE) by a health care provider every 1-3 years as part of a regular health exam.  If you are 34 or older, have a CBE every year. Also consider having a breast X-ray (mammogram) every year.  If you have a family history of breast cancer, talk to your health care provider about genetic screening.  If you are at high risk for breast cancer, talk  to your health care provider about having an MRI and a mammogram every year.  Breast cancer gene (BRCA) assessment is recommended for women who have family members with BRCA-related cancers. BRCA-related cancers include:  Breast.  Ovarian.  Tubal.  Peritoneal cancers.  Results of the assessment will determine the need for genetic counseling and BRCA1 and BRCA2 testing. Cervical Cancer  Your health care provider may recommend that you be screened regularly for cancer of the pelvic organs (ovaries, uterus, and vagina).  This screening involves a pelvic examination, including checking for microscopic changes to the surface of your cervix (Pap test). You may be encouraged to have this screening done every 3 years, beginning at age 24.  For women ages 66-65, health care providers may recommend pelvic exams and Pap testing every 3 years, or they may recommend the Pap and pelvic exam, combined with testing for human papilloma virus (HPV), every 5 years. Some types of HPV increase your risk of cervical cancer. Testing for HPV may also be done on women of any age with unclear Pap test results.  Other health care providers may not recommend any screening for nonpregnant women who are considered low risk for pelvic cancer and who do not have symptoms. Ask your health care provider if a screening pelvic exam is right for you.  If you have had past treatment for cervical cancer or a condition that could lead to cancer, you need Pap tests and screening for cancer for at least 20 years after your treatment. If Pap tests have been discontinued, your risk factors (such as having a new sexual partner) need to be reassessed to determine if screening should resume. Some women have medical problems that increase the chance of getting cervical cancer. In these cases, your health care provider may recommend more frequent screening and Pap tests. Colorectal Cancer  This type of cancer can be detected and often prevented.  Routine colorectal cancer screening usually begins at 46 years of age and continues through 46 years of age.  Your health care provider may recommend screening at an earlier age if you have risk factors for colon cancer.  Your health care provider may also recommend using home test kits to check for hidden blood in the stool.  A small camera at the end of a tube can be used to examine your colon directly (sigmoidoscopy or colonoscopy). This is done to check for the earliest forms of colorectal cancer.  Routine  screening usually begins at age 41.  Direct examination of the colon should be repeated every 5-10 years through 46 years of age. However, you may need to be screened more often if early forms of precancerous polyps or small growths are found. Skin Cancer  Check your skin from head to toe regularly.  Tell your health care provider about any new moles or changes in moles, especially if there is a change in a mole's shape or color.  Also tell your health care provider if you have a mole that is larger than the size of a pencil eraser.  Always use sunscreen. Apply sunscreen liberally and repeatedly throughout the day.  Protect yourself by wearing long sleeves, pants, a wide-brimmed hat, and sunglasses whenever you are outside. Heart disease, diabetes, and high blood pressure  High blood pressure causes heart disease and increases the risk of stroke. High blood pressure is more likely to develop in:  People who have blood pressure in the high end of the normal range (130-139/85-89 mm Hg).  People who are overweight or obese.  People who are African American.  If you are 59-24 years of age, have your blood pressure checked every 3-5 years. If you are 34 years of age or older, have your blood pressure checked every year. You should have your blood pressure measured twice-once when you are at a hospital or clinic, and once when you are not at a hospital or clinic. Record the average of the two measurements. To check your blood pressure when you are not at a hospital or clinic, you can use:  An automated blood pressure machine at a pharmacy.  A home blood pressure monitor.  If you are between 29 years and 60 years old, ask your health care provider if you should take aspirin to prevent strokes.  Have regular diabetes screenings. This involves taking a blood sample to check your fasting blood sugar level.  If you are at a normal weight and have a low risk for diabetes, have this test once  every three years after 46 years of age.  If you are overweight and have a high risk for diabetes, consider being tested at a younger age or more often. Preventing infection Hepatitis B  If you have a higher risk for hepatitis B, you should be screened for this virus. You are considered at high risk for hepatitis B if:  You were born in a country where hepatitis B is common. Ask your health care provider which countries are considered high risk.  Your parents were born in a high-risk country, and you have not been immunized against hepatitis B (hepatitis B vaccine).  You have HIV or AIDS.  You use needles to inject street drugs.  You live with someone who has hepatitis B.  You have had sex with someone who has hepatitis B.  You get hemodialysis treatment.  You take certain medicines for conditions, including cancer, organ transplantation, and autoimmune conditions. Hepatitis C  Blood testing is recommended for:  Everyone born from [birth year] through 1965.  Anyone with known risk factors for hepatitis C. Sexually transmitted infections (STIs)  You should be screened for sexually transmitted infections (STIs) including gonorrhea and chlamydia if:  You are sexually active and are younger than 46 years of age.  You are older than 46 years of age and your health care provider tells you that you are at risk for this type of infection.  Your sexual activity has changed since you were last screened and you are at an increased risk for chlamydia or gonorrhea. Ask your health care provider if you are at risk.  If you do not have HIV, but are at risk, it may be recommended that you take a prescription medicine daily to prevent HIV infection. This is called pre-exposure prophylaxis (PrEP). You are considered at risk if:  You are sexually active and do not regularly use condoms or know the HIV status of your partner(s).  You take drugs by injection.  You are sexually active with a partner  who has HIV. Talk with your health care provider about whether you are at high risk of being infected with HIV. If you choose to begin PrEP, you should first be tested for HIV. You should then be tested every 3 months for as long as you are taking PrEP. Pregnancy  If you are premenopausal and you may become pregnant, ask your health care provider about preconception counseling.  If you may become pregnant, take 400 to 800 micrograms (mcg) of folic acid  every day.  If you want to prevent pregnancy, talk to your health care provider about birth control (contraception). Osteoporosis and menopause  Osteoporosis is a disease in which the bones lose minerals and strength with aging. This can result in serious bone fractures. Your risk for osteoporosis can be identified using a bone density scan.  If you are 4 years of age or older, or if you are at risk for osteoporosis and fractures, ask your health care provider if you should be screened.  Ask your health care provider whether you should take a calcium or vitamin D supplement to lower your risk for osteoporosis.  Menopause may have certain physical symptoms and risks.  Hormone replacement therapy may reduce some of these symptoms and risks. Talk to your health care provider about whether hormone replacement therapy is right for you. Follow these instructions at home:  Schedule regular health, dental, and eye exams.  Stay current with your immunizations.  Do not use any tobacco products including cigarettes, chewing tobacco, or electronic cigarettes.  If you are pregnant, do not drink alcohol.  If you are breastfeeding, limit how much and how often you drink alcohol.  Limit alcohol intake to no more than 1 drink per day for nonpregnant women. One drink equals 12 ounces of beer, 5 ounces of wine, or 1 ounces of hard liquor.  Do not use street drugs.  Do not share needles.  Ask your health care provider for help if you need support  or information about quitting drugs.  Tell your health care provider if you often feel depressed.  Tell your health care provider if you have ever been abused or do not feel safe at home. This information is not intended to replace advice given to you by your health care provider. Make sure you discuss any questions you have with your health care provider. Document Released: 01/19/2011 Document Revised: 12/12/2015 Document Reviewed: 04/09/2015 Elsevier Interactive Patient Education  2017 [employer].

## 2016-12-07 NOTE — Telephone Encounter (Signed)
Pt was seen earlier today for annual exam; pt forgot to request 3 month refill for topiramate and rizatriptan. Please advise. [facility] [address]

## 2016-12-08 ENCOUNTER — Telehealth: Payer: Self-pay

## 2016-12-08 LAB — LIPID PANEL
CHOL/HDL RATIO: 3
Cholesterol: 189 mg/dL (ref 0–200)
HDL: 63.2 mg/dL (ref >39.00)
LDL CALC: 113 mg/dL — AB (ref 0–99)
NONHDL: 126.08
TRIGLYCERIDES: 66 mg/dL (ref 0.0–149.0)
VLDL: 13.2 mg/dL (ref 0.0–40.0)

## 2016-12-08 LAB — CBC
HCT: 41.2 % (ref 36.0–46.0)
Hemoglobin: 13.5 g/dL (ref 12.0–15.0)
MCHC: 32.7 g/dL (ref 30.0–36.0)
MCV: 93.9 fl (ref 78.0–100.0)
Platelets: 324 10*3/uL (ref 150.0–400.0)
RBC: 4.38 Mil/uL (ref 3.87–5.11)
RDW: 14.7 % (ref 11.5–15.5)
WBC: 6.8 10*3/uL (ref 4.0–10.5)

## 2016-12-08 LAB — COMPREHENSIVE METABOLIC PANEL
ALT: 13 U/L (ref 0–35)
AST: 13 U/L (ref 0–37)
Albumin: 4.3 g/dL (ref 3.5–5.2)
Alkaline Phosphatase: 50 U/L (ref 39–117)
BILIRUBIN TOTAL: 0.2 mg/dL (ref 0.2–1.2)
BUN: 11 mg/dL (ref 6–23)
CALCIUM: 9.4 mg/dL (ref 8.4–10.5)
CHLORIDE: 105 meq/L (ref 96–112)
CO2: 24 meq/L (ref 19–32)
CREATININE: 0.85 mg/dL (ref 0.40–1.20)
GFR: 76.45 mL/min (ref >60.00)
Glucose, Bld: 71 mg/dL (ref 70–99)
Potassium: 4.3 mEq/L (ref 3.5–5.1)
Sodium: 138 mEq/L (ref 135–145)
Total Protein: 6.6 g/dL (ref 6.0–8.3)

## 2016-12-08 MED ORDER — PREDNISONE 10 MG PO TABS: ORAL_TABLET | ORAL | 0 refills | Status: DC

## 2016-12-08 MED ORDER — TOPIRAMATE 50 MG PO TABS: 50.0000 mg | ORAL_TABLET | Freq: Two times a day (BID) | ORAL | 0 refills | Status: DC

## 2016-12-08 MED ORDER — RIZATRIPTAN BENZOATE 10 MG PO TABS: 10.0000 mg | ORAL_TABLET | Freq: Three times a day (TID) | ORAL | 4 refills | Status: DC | PRN

## 2016-12-08 NOTE — Telephone Encounter (Signed)
Medication sent to pharmacy  

## 2016-12-08 NOTE — Addendum Note (Signed)
Addended by: Jearld Fenton on: 12/08/2016 10:13 AM   Modules accepted: Orders

## 2016-12-08 NOTE — Telephone Encounter (Signed)
Pt was seen 12/07/16 and thought a steroid was going to be sent to [facility] for elbow problem; pt request cb when med sent to pharmacy. 12/07/16 note has Erx pred taper x 9 days. Not sure of directions per day.Please advise.

## 2016-12-23 ENCOUNTER — Encounter: Payer: BLUE CROSS/BLUE SHIELD | Admitting: Internal Medicine

## 2017-04-23 ENCOUNTER — Ambulatory Visit (INDEPENDENT_AMBULATORY_CARE_PROVIDER_SITE_OTHER): Payer: BLUE CROSS/BLUE SHIELD | Admitting: Family Medicine

## 2017-04-23 ENCOUNTER — Encounter: Payer: Self-pay | Admitting: Internal Medicine

## 2017-04-23 ENCOUNTER — Encounter: Payer: Self-pay | Admitting: Family Medicine

## 2017-04-23 VITALS — BP 112/62 | HR 95 | Temp 98.1°F | Wt 136.0 lb

## 2017-04-23 DIAGNOSIS — J22 Unspecified acute lower respiratory infection: Secondary | ICD-10-CM | POA: Diagnosis not present

## 2017-04-23 MED ORDER — AZITHROMYCIN 250 MG PO TABS: ORAL_TABLET | ORAL | 0 refills | Status: DC

## 2017-04-23 NOTE — Progress Notes (Signed)
BP 112/62 (BP Location: Left Arm, Patient Position: Sitting, Cuff Size: Normal)   Pulse 95   Temp 98.1 F (36.7 C) (Oral)   Wt 136 lb (61.7 kg)   SpO2 99%   BMI 22.81 kg/m    CC: congestion Subjective:    Patient ID: Ronnald Ramp, female    DOB: January 23, 1971, 46 y.o.   MRN: [id number]  HPI: Ameriah Lint is a 46 y.o. female presenting on 04/23/2017 for Cough (Non-productive. Started several wks ago. Has taken OTC mucous caps, helfpul); Chest Pain (burning in upper, center chest); and Sore Throat   2+ week h/o chest > head congestion and chest pressure, sore throat with some PNdrainage, progressive dry cough with coughing fits, hoarse voice. Has felt feverish.   No fevers/chills.   Has tried generic mucous relief pills which helped her expectorate mucous. Some day smoker - e-cig.  No sick contacts at home.   Relevant past medical, surgical, family and social history reviewed and updated as indicated. Interim medical history since our last visit reviewed. Allergies and medications reviewed and updated. Outpatient Medications Prior to Visit  Medication Sig Dispense Refill  . acetaminophen (TYLENOL) 500 MG tablet Take 500 mg by mouth as needed for headache.    . ibuprofen (ADVIL,MOTRIN) 200 MG tablet Take 800 mg by mouth as needed for headache.     Marland Kitchen OVER THE COUNTER MEDICATION Take 1 each by mouth 2 (two) times daily. Cumin black seed oil    . rizatriptan (MAXALT) 10 MG tablet Take 1 tablet (10 mg total) by mouth 3 (three) times daily as needed for migraine. 10 tablet 4  . topiramate (TOPAMAX) 50 MG tablet Take 1 tablet (50 mg total) by mouth 2 (two) times daily. 180 tablet 0  . predniSONE (DELTASONE) 10 MG tablet Take 3 tabs on days 1-3, take 2 tabs on days 4-6, take 1 tab on days 7-9 18 tablet 0   No facility-administered medications prior to visit.      Per HPI unless specifically indicated in ROS section below Review of Systems     Objective:    BP 112/62 (BP Location:  Left Arm, Patient Position: Sitting, Cuff Size: Normal)   Pulse 95   Temp 98.1 F (36.7 C) (Oral)   Wt 136 lb (61.7 kg)   SpO2 99%   BMI 22.81 kg/m   Wt Readings from Last 3 Encounters:  04/23/17 136 lb (61.7 kg)  12/07/16 134 lb 12 oz (61.1 kg)  10/30/15 131 lb 4 oz (59.5 kg)    Physical Exam  Constitutional: She appears well-developed and well-nourished. No distress.  HENT:  Head: Normocephalic and atraumatic.  Right Ear: Hearing, tympanic membrane, external ear and ear canal normal.  Left Ear: Hearing, tympanic membrane, external ear and ear canal normal.  Nose: No mucosal edema or rhinorrhea. Right sinus exhibits no maxillary sinus tenderness and no frontal sinus tenderness. Left sinus exhibits no maxillary sinus tenderness and no frontal sinus tenderness.  Mouth/Throat: Uvula is midline and mucous membranes are normal. Posterior oropharyngeal erythema present. No oropharyngeal exudate, posterior oropharyngeal edema or tonsillar abscesses.  Mild nasal mucosal erythema  Eyes: Pupils are equal, round, and reactive to light. Conjunctivae and EOM are normal. No scleral icterus.  Neck: Normal range of motion. Neck supple.  Cardiovascular: Normal rate, regular rhythm, normal heart sounds and intact distal pulses.   No murmur heard. Pulmonary/Chest: Effort normal and breath sounds normal. No respiratory distress. She has no wheezes. She has no rales.  Lymphadenopathy:    She has no cervical adenopathy.  Skin: Skin is warm and dry. No rash noted.  Nursing note and vitals reviewed.     Assessment & Plan:   Problem List Items Addressed This Visit    Acute respiratory infection - Primary    Anticipate viral bronchitis given duration and improvement over last 24 hours. Reviewed with patient. rec ibuprofen 400-600mg  with meals x 3-5 days, continue expectorant. Reviewed anticipated course of improvement.  WASP for zpack provided today with discussion on when to fill - in case any  worsening symptoms. Pt agrees with plan.       Relevant Medications   azithromycin (ZITHROMAX) 250 MG tablet       Follow up plan: Return if symptoms worsen or fail to improve.  Ria Bush, MD

## 2017-04-23 NOTE — Patient Instructions (Signed)
I do think you have bronchitis, possibly viral as getting better. Push fluids, continue mucous relief medicine, take ibuprofen 400-600mg  with meals for next 3-5 days.  If fever >101, worsening productive cough, or any worsening, fill antibiotic provided today (zpack).

## 2017-04-23 NOTE — Assessment & Plan Note (Addendum)
Anticipate viral bronchitis given duration and improvement over last 24 hours. Reviewed with patient. rec ibuprofen 400-600mg  with meals x 3-5 days, continue expectorant. Reviewed anticipated course of improvement.  WASP for zpack provided today with discussion on when to fill - in case any worsening symptoms. Pt agrees with plan.

## 2017-04-26 ENCOUNTER — Ambulatory Visit: Payer: Self-pay | Admitting: Internal Medicine

## 2017-05-16 ENCOUNTER — Other Ambulatory Visit: Payer: Self-pay | Admitting: Internal Medicine

## 2017-05-17 NOTE — Telephone Encounter (Signed)
Baity pt, last filled 12/08/2016 90 days supply... Last OV CPE 11/2016... Please advise, send back to me if neccessary

## 2017-05-21 ENCOUNTER — Other Ambulatory Visit: Payer: Self-pay | Admitting: Internal Medicine

## 2017-07-26 ENCOUNTER — Ambulatory Visit (INDEPENDENT_AMBULATORY_CARE_PROVIDER_SITE_OTHER): Payer: BLUE CROSS/BLUE SHIELD | Admitting: Internal Medicine

## 2017-07-26 ENCOUNTER — Encounter: Payer: Self-pay | Admitting: Internal Medicine

## 2017-07-26 VITALS — BP 108/64 | HR 73 | Temp 98.0°F | Wt 146.8 lb

## 2017-07-26 DIAGNOSIS — R3915 Urgency of urination: Secondary | ICD-10-CM | POA: Diagnosis not present

## 2017-07-26 DIAGNOSIS — R351 Nocturia: Secondary | ICD-10-CM | POA: Diagnosis not present

## 2017-07-26 DIAGNOSIS — R350 Frequency of micturition: Secondary | ICD-10-CM

## 2017-07-26 LAB — POCT URINALYSIS DIPSTICK
Bilirubin, UA: NEGATIVE
Glucose, UA: NEGATIVE
Ketones, UA: NEGATIVE
LEUKOCYTES UA: NEGATIVE
NITRITE UA: NEGATIVE
PROTEIN UA: NEGATIVE
RBC UA: NEGATIVE
SPEC GRAV UA: 1.015 (ref 1.010–1.025)
Urobilinogen, UA: 0.2 E.U./dL
pH, UA: 8 (ref 5.0–8.0)

## 2017-07-26 MED ORDER — RIZATRIPTAN BENZOATE 10 MG PO TABS: 10.0000 mg | ORAL_TABLET | Freq: Three times a day (TID) | ORAL | 4 refills | Status: DC | PRN

## 2017-07-26 NOTE — Addendum Note (Signed)
Addended by: Emelia Salisbury C on: 07/26/2017 03:51 PM   Modules accepted: Orders

## 2017-07-26 NOTE — Progress Notes (Signed)
HPI  Pt presents to the clinic today with c/o urinary urgency, frequency and nocturia. This started about 5 days ago. She denies dysuria, blood in her urine or vaginal complaints. She denies fever, chills or nausea but has had some low back pain. She has not tried anything OTC for her symptoms. She does not drink a lot of fluids. She has no history of kidney stones.    Review of Systems  Past Medical History:  Diagnosis Date  . Chicken pox   . Migraines     Family History  Problem Relation Age of Onset  . Hypertension Mother   . Diabetes Mother   . Arthritis Father   . Hypertension Maternal Uncle   . Hypertension Maternal Grandmother   . Hypertension Maternal Grandfather   . Cancer Paternal Grandfather     Social History   Socioeconomic History  . Marital status: Married    Spouse name: Not on file  . Number of children: Not on file  . Years of education: Not on file  . Highest education level: Not on file  Social Needs  . Financial resource strain: Not on file  . Food insecurity - worry: Not on file  . Food insecurity - inability: Not on file  . Transportation needs - medical: Not on file  . Transportation needs - non-medical: Not on file  Occupational History  . Not on file  Tobacco Use  . Smoking status: Current Some Day Smoker    Types: E-cigarettes  . Smokeless tobacco: Never Used  . Tobacco comment: quit cigarettes 2014--now using e cigs  Substance and Sexual Activity  . Alcohol use: Yes    Alcohol/week: 0.0 oz    Comment: rare  . Drug use: No  . Sexual activity: Yes  Other Topics Concern  . Not on file  Social History Narrative  . Not on file    Allergies  Allergen Reactions  . Morphine And Related Other (See Comments)    Many years ago--does not remember      Constitutional: Denies fever, malaise, fatigue, headache or abrupt weight changes.   GU: Pt reports urgency, frequency and nocturia. Denies burning sensation, blood in urine, odor or  discharge. Skin: Denies redness, rashes, lesions or ulcercations.   No other specific complaints in a complete review of systems (except as listed in HPI above).    Objective:   Physical Exam  BP 108/64   Pulse 73   Temp 98 F (36.7 C) (Oral)   Wt 146 lb 12 oz (66.6 kg)   BMI 24.61 kg/m   Wt Readings from Last 3 Encounters:  07/26/17 146 lb 12 oz (66.6 kg)  04/23/17 136 lb (61.7 kg)  12/07/16 134 lb 12 oz (61.1 kg)    General: Appears her stated age, well developed, well nourished in NAD.   Pulmonary/Chest: Normal effort and positive vesicular breath sounds. No respiratory distress. No wheezes, rales or ronchi noted.  Abdomen: Soft. Normal bowel sounds. No distention or masses noted.  Tender to palpation over the bladder area. No CVA tenderness.       Assessment & Plan:   Urgency, Frequency, Nocturia:  Urinalysis: normal Will send urine culture OK to take AZO OTC Drink plenty of fluids  RTC as needed or if symptoms persist. Webb Silversmith, NP

## 2017-07-26 NOTE — Patient Instructions (Signed)
Interstitial Cystitis Interstitial cystitis is a condition that causes inflammation of the bladder. The bladder is a hollow organ in the lower part of your abdomen. It stores urine after the urine is made by your kidneys. With interstitial cystitis, you may have pain in the bladder area. You may also have a frequent and urgent need to urinate. The severity of interstitial cystitis can vary from person to person. You may have flare-ups of the condition, and then it may go away for a while. For many people who have this condition, it becomes a long-term problem. What are the causes? The cause of this condition is not known. What increases the risk? This condition is more likely to develop in women. What are the signs or symptoms? Symptoms of interstitial cystitis vary, and they can change over time. Symptoms may include:  Discomfort or pain in the bladder area. This can range from mild to severe. The pain may change in intensity as the bladder fills with urine or as it empties.  Pelvic pain.  An urgent need to urinate.  Frequent urination.  Pain during sexual intercourse.  Pinpoint bleeding on the bladder wall.  For women, the symptoms often get worse during menstruation. How is this diagnosed? This condition is diagnosed by evaluating your symptoms and ruling out other causes. A physical exam will be done. Various tests may be done to rule out other conditions. Common tests include:  Urine tests.  Cystoscopy. In this test, a tool that is like a very thin telescope is used to look into your bladder.  Biopsy. This involves taking a sample of tissue from the bladder wall to be examined under a microscope.  How is this treated? There is no cure for interstitial cystitis, but treatment methods are available to control your symptoms. Work closely with your health care provider to find the treatments that will be most effective for you. Treatment options may include:  Medicines to relieve  pain and to help reduce the number of times that you feel the need to urinate.  Bladder training. This involves learning ways to control when you urinate, such as: ? Urinating at scheduled times. ? Training yourself to delay urination. ? Doing exercises (Kegel exercises) to strengthen the muscles that control urine flow.  Lifestyle changes, such as changing your diet or taking steps to control stress.  Use of a device that provides electrical stimulation in order to reduce pain.  A procedure that stretches your bladder by filling it with air or fluid.  Surgery. This is rare. It is only done for extreme cases if other treatments do not help.  Follow these instructions at home:  Take medicines only as directed by your health care provider.  Use bladder training techniques as directed. ? Keep a bladder diary to find out which foods, liquids, or activities make your symptoms worse. ? Use your bladder diary to schedule bathroom trips. If you are away from home, plan to be near a bathroom at each of your scheduled times. ? Make sure you urinate just before you leave the house and just before you go to bed.  Do Kegel exercises as directed by your health care provider.  Do not drink alcohol.  Do not use any tobacco products, including cigarettes, chewing tobacco, or electronic cigarettes. If you need help quitting, ask your health care provider.  Make dietary changes as directed by your health care provider. You may need to avoid spicy foods and foods that contain a high amount   of potassium.  Limit your drinking of beverages that stimulate urination. These include soda, coffee, and tea.  Keep all follow-up visits as directed by your health care provider. This is important. Contact a health care provider if:  Your symptoms do not get better after treatment.  Your pain and discomfort are getting worse.  You have more frequent urges to urinate.  You have a fever. Get help right away  if:  You are not able to control your bladder at all. This information is not intended to replace advice given to you by your health care provider. Make sure you discuss any questions you have with your health care provider. Document Released: 03/06/2004 Document Revised: 12/12/2015 Document Reviewed: 03/13/2014 Elsevier Interactive Patient Education  2018 [employer]vier Inc.  

## 2017-07-27 LAB — URINE CULTURE
MICRO NUMBER:: 90022714
RESULT: NO GROWTH
SPECIMEN QUALITY:: ADEQUATE

## 2017-08-18 ENCOUNTER — Other Ambulatory Visit: Payer: Self-pay | Admitting: Family Medicine

## 2017-11-10 ENCOUNTER — Other Ambulatory Visit: Payer: Self-pay | Admitting: Family Medicine

## 2017-11-14 ENCOUNTER — Other Ambulatory Visit: Payer: Self-pay | Admitting: Family Medicine

## 2018-04-22 ENCOUNTER — Other Ambulatory Visit: Payer: Self-pay | Admitting: Family Medicine

## 2018-04-26 ENCOUNTER — Other Ambulatory Visit: Payer: Self-pay | Admitting: Family Medicine

## 2018-04-27 NOTE — Telephone Encounter (Signed)
Topamax Last filled:  4/26/419, #180 Last OV:  07/26/17 Next OV:  none

## 2018-04-28 ENCOUNTER — Other Ambulatory Visit: Payer: Self-pay | Admitting: Internal Medicine

## 2018-04-29 MED ORDER — TOPIRAMATE 50 MG PO TABS: 50.0000 mg | ORAL_TABLET | Freq: Two times a day (BID) | ORAL | 0 refills | Status: DC

## 2018-04-29 NOTE — Addendum Note (Signed)
Addended by: Lurlean Nanny on: 04/29/2018 08:28 AM   Modules accepted: Orders

## 2018-05-03 ENCOUNTER — Ambulatory Visit: Payer: BLUE CROSS/BLUE SHIELD | Admitting: Internal Medicine

## 2018-05-05 ENCOUNTER — Ambulatory Visit: Payer: BLUE CROSS/BLUE SHIELD | Admitting: Internal Medicine

## 2018-05-05 ENCOUNTER — Encounter: Payer: Self-pay | Admitting: Internal Medicine

## 2018-05-05 DIAGNOSIS — G43019 Migraine without aura, intractable, without status migrainosus: Secondary | ICD-10-CM | POA: Diagnosis not present

## 2018-05-05 MED ORDER — TOPIRAMATE 50 MG PO TABS: 50.0000 mg | ORAL_TABLET | Freq: Two times a day (BID) | ORAL | 1 refills | Status: DC

## 2018-05-06 ENCOUNTER — Encounter: Payer: Self-pay | Admitting: Internal Medicine

## 2018-05-06 NOTE — Assessment & Plan Note (Signed)
She will plan to hold Topamax for now unless migraines return Continue Maxalt prn Continue B12 and Magnesium OTC Will monitor

## 2018-05-06 NOTE — Patient Instructions (Signed)

## 2018-05-06 NOTE — Progress Notes (Signed)
Subjective:    Patient ID: Emily Dalton, female    DOB: [birth year], 47 y.o.   MRN: [id number]  HPI  Pt presents to the clinic today to follow up migraines. She reports her migraines are usually hormonal, she is getting them about 1 x month. They have not changed. She actually ran out of her Topamax 3 days ago and is considering staying off it to see how she does but she would like it refilled in case she wants to restart it. She takes the Maxalt rather infrequently. She has started B12 and Magnesium OTC as well as she has done research that this helps with migraines.   Review of Systems  Past Medical History:  Diagnosis Date  . Chicken pox   . Migraines     Current Outpatient Medications  Medication Sig Dispense Refill  . acetaminophen (TYLENOL) 500 MG tablet Take 500 mg by mouth as needed for headache.    . ibuprofen (ADVIL,MOTRIN) 200 MG tablet Take 800 mg by mouth as needed for headache.     . rizatriptan (MAXALT) 10 MG tablet Take 1 tablet (10 mg total) by mouth 3 (three) times daily as needed for migraine. 10 tablet 4  . topiramate (TOPAMAX) 50 MG tablet Take 1 tablet (50 mg total) by mouth 2 (two) times daily. 180 tablet 1   No current facility-administered medications for this visit.     Allergies  Allergen Reactions  . Morphine And Related Other (See Comments)    Many years ago--does not remember     Family History  Problem Relation Age of Onset  . Hypertension Mother   . Diabetes Mother   . Arthritis Father   . Hypertension Maternal Uncle   . Hypertension Maternal Grandmother   . Hypertension Maternal Grandfather   . Cancer Paternal Grandfather     Social History   Socioeconomic History  . Marital status: Married    Spouse name: Not on file  . Number of children: Not on file  . Years of education: Not on file  . Highest education level: Not on file  Occupational History  . Not on file  Social Needs  . Financial resource strain: Not on file  . Food  insecurity:    Worry: Not on file    Inability: Not on file  . Transportation needs:    Medical: Not on file    Non-medical: Not on file  Tobacco Use  . Smoking status: Current Some Day Smoker    Types: E-cigarettes  . Smokeless tobacco: Never Used  . Tobacco comment: quit cigarettes 2014--now using e cigs  Substance and Sexual Activity  . Alcohol use: No    Alcohol/week: 0.0 standard drinks    Frequency: Never  . Drug use: No  . Sexual activity: Yes  Lifestyle  . Physical activity:    Days per week: Not on file    Minutes per session: Not on file  . Stress: Not on file  Relationships  . Social connections:    Talks on phone: Not on file    Gets together: Not on file    Attends religious service: Not on file    Active member of club or organization: Not on file    Attends meetings of clubs or organizations: Not on file    Relationship status: Not on file  . Intimate partner violence:    Fear of current or ex partner: Not on file    Emotionally abused: Not on file  Physically abused: Not on file    Forced sexual activity: Not on file  Other Topics Concern  . Not on file  Social History Narrative  . Not on file     Constitutional: Pt reports intermittent headaches. Denies fever, malaise, fatigue, or abrupt weight changes.  Respiratory: Denies difficulty breathing, shortness of breath, cough or sputum production.   Cardiovascular: Denies chest pain, chest tightness, palpitations or swelling in the hands or feet.  Neurological: Denies dizziness, difficulty with memory, difficulty with speech or problems with balance and coordination.    No other specific complaints in a complete review of systems (except as listed in HPI above).     Objective:   Physical Exam  BP 108/70   Pulse 74   Temp 97.9 F (36.6 C) (Oral)   Wt 149 lb (67.6 kg)   SpO2 98%   BMI 24.99 kg/m  Wt Readings from Last 3 Encounters:  05/05/18 149 lb (67.6 kg)  07/26/17 146 lb 12 oz (66.6 kg)   04/23/17 136 lb (61.7 kg)    General: Appears her stated age, well developed, well nourished in NAD. Cardiovascular: Normal rate and rhythm.  Pulmonary/Chest: Normal effort and positive vesicular breath sounds. No respiratory distress. No wheezes, rales or ronchi noted.  Neurological: Alert and oriented.  Coordination normal.  Psychiatric: Mood and affect normal. Behavior is normal. Judgment and thought content normal.     BMET    Component Value Date/Time   NA 138 12/07/2016 1621   K 4.3 12/07/2016 1621   CL 105 12/07/2016 1621   CO2 24 12/07/2016 1621   GLUCOSE 71 12/07/2016 1621   BUN 11 12/07/2016 1621   CREATININE 0.85 12/07/2016 1621   CALCIUM 9.4 12/07/2016 1621    Lipid Panel     Component Value Date/Time   CHOL 189 12/07/2016 1621   TRIG 66.0 12/07/2016 1621   HDL 63.20 12/07/2016 1621   CHOLHDL 3 12/07/2016 1621   VLDL 13.2 12/07/2016 1621   LDLCALC 113 (H) 12/07/2016 1621    CBC    Component Value Date/Time   WBC 6.8 12/07/2016 1621   RBC 4.38 12/07/2016 1621   HGB 13.5 12/07/2016 1621   HCT 41.2 12/07/2016 1621   PLT 324.0 12/07/2016 1621   MCV 93.9 12/07/2016 1621   MCHC 32.7 12/07/2016 1621   RDW 14.7 12/07/2016 1621    Hgb A1C No results found for: HGBA1C         Assessment & Plan:

## 2018-05-18 ENCOUNTER — Ambulatory Visit (INDEPENDENT_AMBULATORY_CARE_PROVIDER_SITE_OTHER): Payer: BLUE CROSS/BLUE SHIELD | Admitting: Internal Medicine

## 2018-05-18 ENCOUNTER — Ambulatory Visit: Payer: BLUE CROSS/BLUE SHIELD | Admitting: Internal Medicine

## 2018-05-18 ENCOUNTER — Encounter: Payer: Self-pay | Admitting: Internal Medicine

## 2018-05-18 VITALS — BP 106/74 | HR 66 | Temp 98.0°F | Ht 64.75 in | Wt 151.0 lb

## 2018-05-18 DIAGNOSIS — Z1239 Encounter for other screening for malignant neoplasm of breast: Secondary | ICD-10-CM

## 2018-05-18 DIAGNOSIS — Z0000 Encounter for general adult medical examination without abnormal findings: Secondary | ICD-10-CM

## 2018-05-18 NOTE — Patient Instructions (Signed)

## 2018-05-18 NOTE — Progress Notes (Signed)
Subjective:    Patient ID: Emily Dalton, female    DOB: October 15, 1970, 47 y.o.   MRN: [id number]  HPI  Pt presents to the clinic today for her annual exam.  Flu: never Tetanus: 10/2010 Pneumovax: 2009 Pap Smear: 2015 in [place] Mammogram: 11/2010 Vision Screening: annually Dentist: biannually  Diet: She does eat meat. She consumes more fruits and veggies. She does not eat fried foods. She drinks mostly coffee, water. Exercise: None  Review of Systems      Past Medical History:  Diagnosis Date  . Chicken pox   . Migraines     Current Outpatient Medications  Medication Sig Dispense Refill  . acetaminophen (TYLENOL) 500 MG tablet Take 500 mg by mouth as needed for headache.    . ibuprofen (ADVIL,MOTRIN) 200 MG tablet Take 800 mg by mouth as needed for headache.     . rizatriptan (MAXALT) 10 MG tablet Take 1 tablet (10 mg total) by mouth 3 (three) times daily as needed for migraine. 10 tablet 4  . topiramate (TOPAMAX) 50 MG tablet Take 1 tablet (50 mg total) by mouth 2 (two) times daily. 180 tablet 1   No current facility-administered medications for this visit.     Allergies  Allergen Reactions  . Morphine And Related Other (See Comments)    Many years ago--does not remember     Family History  Problem Relation Age of Onset  . Hypertension Mother   . Diabetes Mother   . Arthritis Father   . Hypertension Maternal Uncle   . Hypertension Maternal Grandmother   . Hypertension Maternal Grandfather   . Cancer Paternal Grandfather     Social History   Socioeconomic History  . Marital status: Married    Spouse name: Not on file  . Number of children: Not on file  . Years of education: Not on file  . Highest education level: Not on file  Occupational History  . Not on file  Social Needs  . Financial resource strain: Not on file  . Food insecurity:    Worry: Not on file    Inability: Not on file  . Transportation needs:    Medical: Not on file    Non-medical: Not  on file  Tobacco Use  . Smoking status: Current Some Day Smoker    Types: E-cigarettes  . Smokeless tobacco: Never Used  . Tobacco comment: quit cigarettes 2014--now using e cigs  Substance and Sexual Activity  . Alcohol use: No    Alcohol/week: 0.0 standard drinks    Frequency: Never  . Drug use: No  . Sexual activity: Yes  Lifestyle  . Physical activity:    Days per week: Not on file    Minutes per session: Not on file  . Stress: Not on file  Relationships  . Social connections:    Talks on phone: Not on file    Gets together: Not on file    Attends religious service: Not on file    Active member of club or organization: Not on file    Attends meetings of clubs or organizations: Not on file    Relationship status: Not on file  . Intimate partner violence:    Fear of current or ex partner: Not on file    Emotionally abused: Not on file    Physically abused: Not on file    Forced sexual activity: Not on file  Other Topics Concern  . Not on file  Social History Narrative  . Not  on file     Constitutional: Pt reports intermittent headaches. Denies fever, malaise, fatigue, or abrupt weight changes.  HEENT: Denies eye pain, eye redness, ear pain, ringing in the ears, wax buildup, runny nose, nasal congestion, bloody nose, or sore throat. Respiratory: Denies difficulty breathing, shortness of breath, cough or sputum production.   Cardiovascular: Denies chest pain, chest tightness, palpitations or swelling in the hands or feet.  Gastrointestinal: Denies abdominal pain, bloating, constipation, diarrhea or blood in the stool.  GU: Denies urgency, frequency, pain with urination, burning sensation, blood in urine, odor or discharge. Musculoskeletal: Denies decrease in range of motion, difficulty with gait, muscle pain or joint pain and swelling.  Skin: Denies redness, rashes, lesions or ulcercations.  Neurological: Denies dizziness, difficulty with memory, difficulty with speech or  problems with balance and coordination.  Psych: Denies anxiety, depression, SI/HI.  No other specific complaints in a complete review of systems (except as listed in HPI above).  Objective:   Physical Exam   BP 106/74   Pulse 66   Temp 98 F (36.7 C) (Oral)   Ht 5' 4.75" (1.645 m)   Wt 151 lb (68.5 kg)   SpO2 98%   BMI 25.32 kg/m  Wt Readings from Last 3 Encounters:  05/18/18 151 lb (68.5 kg)  05/05/18 149 lb (67.6 kg)  07/26/17 146 lb 12 oz (66.6 kg)    General: Appears her stated age, well developed, well nourished in NAD. Skin: Warm, dry and intact.  HEENT: Head: normal shape and size; Eyes: sclera white, no icterus, conjunctiva pink, PERRLA and EOMs intact; Ears: Tm's gray and intact, normal light reflex;Throat/Mouth: Teeth present, mucosa pink and moist, no exudate, lesions or ulcerations noted.  Neck:  Neck supple, trachea midline. No masses, lumps or thyromegaly present.  Cardiovascular: Normal rate and rhythm. S1,S2 noted.  No murmur, rubs or gallops noted. No JVD or BLE edema.  Pulmonary/Chest: Normal effort and positive vesicular breath sounds. No respiratory distress. No wheezes, rales or ronchi noted.  Abdomen: Soft and nontender. Normal bowel sounds. No distention or masses noted. Liver, spleen and kidneys non palpable. Musculoskeletal: Strength 5/5 BUE/BLE. No difficulty with gait.  Neurological: Alert and oriented. Cranial nerves II-XII grossly intact. Coordination normal.  Psychiatric: Mood and affect normal. Behavior is normal. Judgment and thought content normal.    BMET    Component Value Date/Time   NA 138 12/07/2016 1621   K 4.3 12/07/2016 1621   CL 105 12/07/2016 1621   CO2 24 12/07/2016 1621   GLUCOSE 71 12/07/2016 1621   BUN 11 12/07/2016 1621   CREATININE 0.85 12/07/2016 1621   CALCIUM 9.4 12/07/2016 1621    Lipid Panel     Component Value Date/Time   CHOL 189 12/07/2016 1621   TRIG 66.0 12/07/2016 1621   HDL 63.20 12/07/2016 1621    CHOLHDL 3 12/07/2016 1621   VLDL 13.2 12/07/2016 1621   LDLCALC 113 (H) 12/07/2016 1621    CBC    Component Value Date/Time   WBC 6.8 12/07/2016 1621   RBC 4.38 12/07/2016 1621   HGB 13.5 12/07/2016 1621   HCT 41.2 12/07/2016 1621   PLT 324.0 12/07/2016 1621   MCV 93.9 12/07/2016 1621   MCHC 32.7 12/07/2016 1621   RDW 14.7 12/07/2016 1621    Hgb A1C No results found for: HGBA1C         Assessment & Plan:   Preventative Health Maintenance:  She declines flu shot today Tetanus UTD Pap smear due  2020 Mammogram ordered, she will call to schedule Encouraged her to consume a balanced diet and exercise regimen Advised her to see an eye doctor and dentist annually Will check CBC, CMET, TSH, Lipid and Vit D today  RTC in 1 year, sooner if needed Webb Silversmith, NP

## 2018-05-19 ENCOUNTER — Other Ambulatory Visit (INDEPENDENT_AMBULATORY_CARE_PROVIDER_SITE_OTHER): Payer: BLUE CROSS/BLUE SHIELD

## 2018-05-19 DIAGNOSIS — Z0000 Encounter for general adult medical examination without abnormal findings: Secondary | ICD-10-CM | POA: Diagnosis not present

## 2018-05-19 LAB — COMPREHENSIVE METABOLIC PANEL
ALBUMIN: 4.2 g/dL (ref 3.5–5.2)
ALK PHOS: 51 U/L (ref 39–117)
ALT: 15 U/L (ref 0–35)
AST: 14 U/L (ref 0–37)
BILIRUBIN TOTAL: 0.4 mg/dL (ref 0.2–1.2)
BUN: 12 mg/dL (ref 6–23)
CALCIUM: 9.2 mg/dL (ref 8.4–10.5)
CO2: 28 mEq/L (ref 19–32)
Chloride: 106 mEq/L (ref 96–112)
Creatinine, Ser: 0.8 mg/dL (ref 0.40–1.20)
GFR: 81.48 mL/min (ref >60.00)
Glucose, Bld: 82 mg/dL (ref 70–99)
Potassium: 4.7 mEq/L (ref 3.5–5.1)
Sodium: 139 mEq/L (ref 135–145)
Total Protein: 6.4 g/dL (ref 6.0–8.3)

## 2018-05-19 LAB — VITAMIN D 25 HYDROXY (VIT D DEFICIENCY, FRACTURES): VITD: 21.59 ng/mL — ABNORMAL LOW (ref 30.00–100.00)

## 2018-05-19 LAB — CBC
HCT: 38 % (ref 36.0–46.0)
HEMOGLOBIN: 12.8 g/dL (ref 12.0–15.0)
MCHC: 33.7 g/dL (ref 30.0–36.0)
MCV: 91.8 fl (ref 78.0–100.0)
PLATELETS: 293 10*3/uL (ref 150.0–400.0)
RBC: 4.14 Mil/uL (ref 3.87–5.11)
RDW: 13.4 % (ref 11.5–15.5)
WBC: 7 10*3/uL (ref 4.0–10.5)

## 2018-05-19 LAB — LIPID PANEL
Cholesterol: 209 mg/dL — ABNORMAL HIGH (ref 0–200)
HDL: 64.2 mg/dL (ref >39.00)
LDL Cholesterol: 133 mg/dL — ABNORMAL HIGH (ref 0–99)
NonHDL: 145.22
TRIGLYCERIDES: 62 mg/dL (ref 0.0–149.0)
Total CHOL/HDL Ratio: 3
VLDL: 12.4 mg/dL (ref 0.0–40.0)

## 2018-05-19 LAB — TSH: TSH: 1.15 u[IU]/mL (ref 0.35–4.50)

## 2018-05-19 LAB — VITAMIN B12: Vitamin B-12: 589 pg/mL (ref 211–911)

## 2018-05-20 ENCOUNTER — Other Ambulatory Visit: Payer: Self-pay | Admitting: Internal Medicine

## 2018-05-20 DIAGNOSIS — E559 Vitamin D deficiency, unspecified: Secondary | ICD-10-CM

## 2018-05-20 MED ORDER — VITAMIN D (ERGOCALCIFEROL) 1.25 MG (50000 UNIT) PO CAPS: 50000.0000 [IU] | ORAL_CAPSULE | ORAL | 0 refills | Status: DC

## 2018-05-24 ENCOUNTER — Telehealth: Payer: Self-pay

## 2018-05-24 NOTE — Telephone Encounter (Addendum)
Left message on voicemail   ----- Message from Jearld Fenton, NP sent at 05/20/2018  3:08 PM EDT ----- Call pt:  Vit D low. Sending in Ergocalciferol. Repeat Vit D in 3 months, lab only.

## 2018-05-25 NOTE — Telephone Encounter (Signed)
[facility] faxed a note that pt was returning call to [facility].

## 2018-05-26 NOTE — Telephone Encounter (Signed)
Pt cb to speak with Melanie. She is going back from lunch so she requested for Melanie to lvm.

## 2018-07-26 ENCOUNTER — Ambulatory Visit
Admission: RE | Admit: 2018-07-26 | Discharge: 2018-07-26 | Disposition: A | Discharge status: Home / Self Care | Payer: BLUE CROSS/BLUE SHIELD | Source: Ambulatory Visit | Attending: Internal Medicine | Admitting: Internal Medicine

## 2018-07-26 DIAGNOSIS — Z1231 Encounter for screening mammogram for malignant neoplasm of breast: Secondary | ICD-10-CM | POA: Diagnosis not present

## 2018-07-26 DIAGNOSIS — Z1239 Encounter for other screening for malignant neoplasm of breast: Secondary | ICD-10-CM | POA: Diagnosis not present

## 2018-08-11 ENCOUNTER — Other Ambulatory Visit: Payer: Self-pay | Admitting: Internal Medicine

## 2018-08-11 DIAGNOSIS — E559 Vitamin D deficiency, unspecified: Secondary | ICD-10-CM

## 2018-09-03 ENCOUNTER — Other Ambulatory Visit: Payer: Self-pay | Admitting: Internal Medicine

## 2018-09-03 DIAGNOSIS — E559 Vitamin D deficiency, unspecified: Secondary | ICD-10-CM

## 2018-10-10 ENCOUNTER — Ambulatory Visit (INDEPENDENT_AMBULATORY_CARE_PROVIDER_SITE_OTHER): Payer: BLUE CROSS/BLUE SHIELD | Admitting: Internal Medicine

## 2018-10-10 ENCOUNTER — Telehealth: Payer: Self-pay

## 2018-10-10 ENCOUNTER — Other Ambulatory Visit: Payer: Self-pay

## 2018-10-10 ENCOUNTER — Encounter: Payer: Self-pay | Admitting: Internal Medicine

## 2018-10-10 DIAGNOSIS — R0982 Postnasal drip: Secondary | ICD-10-CM | POA: Diagnosis not present

## 2018-10-10 DIAGNOSIS — J301 Allergic rhinitis due to pollen: Secondary | ICD-10-CM

## 2018-10-10 DIAGNOSIS — J069 Acute upper respiratory infection, unspecified: Secondary | ICD-10-CM

## 2018-10-10 NOTE — Telephone Encounter (Signed)
I spoke with pt; see appt notes.

## 2018-10-10 NOTE — Patient Instructions (Signed)
Postnasal Drip  Postnasal drip is the feeling of mucus going down the back of your throat. Mucus is a slimy substance that moistens and cleans your nose and throat, as well as the air pockets in face bones near your forehead and cheeks (sinuses). Small amounts of mucus pass from your nose and sinuses down the back of your throat all the time. This is normal. When you produce too much mucus or the mucus gets too thick, you can feel it.  Some common causes of postnasal drip include:   Having more mucus because of:  ? A cold or the flu.  ? Allergies.  ? Cold air.  ? Certain medicines.   Having more mucus that is thicker because of:  ? A sinus or nasal infection.  ? Dry air.  ? A food allergy.  Follow these instructions at home:  Relieving discomfort     Gargle with a salt-water mixture 3-4 times a day or as needed. To make a salt-water mixture, completely dissolve -1 tsp of salt in 1 cup of warm water.   If the air in your home is dry, use a humidifier to add moisture to the air.   Use a saline spray or container (neti pot) to flush out the nose (nasal irrigation). These methods can help clear away mucus and keep the nasal passages moist.  General instructions   Take over-the-counter and prescription medicines only as told by your health care provider.   Follow instructions from your health care provider about eating or drinking restrictions. You may need to avoid caffeine.   Avoid things that you know you are allergic to (allergens), like dust, mold, pollen, pets, or certain foods.   Drink enough fluid to keep your urine pale yellow.   Keep all follow-up visits as told by your health care provider. This is important.  Contact a health care provider if:   You have a fever.   You have a sore throat.   You have difficulty swallowing.   You have headache.   You have sinus pain.   You have a cough that does not go away.   The mucus from your nose becomes thick and is green or yellow in color.   You have  cold or flu symptoms that last more than 10 days.  Summary   Postnasal drip is the feeling of mucus going down the back of your throat.   If your health care provider approves, use nasal irrigation or a nasal spray 2?4 times a day.   Avoid things that you know you are allergic to (allergens), like dust, mold, pollen, pets, or certain foods.  This information is not intended to replace advice given to you by your health care provider. Make sure you discuss any questions you have with your health care provider.  Document Released: 10/19/2016 Document Revised: 10/19/2016 Document Reviewed: 10/19/2016  Elsevier Interactive Patient Education  2019 [employer]vier Inc[employer]

## 2018-10-10 NOTE — Telephone Encounter (Signed)
Pt left message on triage line. Fever, sore throat, sinus drainage. Asking for advice.

## 2018-10-10 NOTE — Progress Notes (Signed)
Virtual Visit via Telephone Note  I connected with Emily Dalton on 10/10/18 at 10:12 am by telephone and verified that I am speaking with the correct person using two identifiers.   I discussed the limitations, risks, security and privacy concerns of performing an evaluation and management service by telephone and the availability of in person appointments. I also discussed with the patient that there may be a patient responsible charge related to this service. The patient expressed understanding and agreed to proceed.   History of Present Illness: Pt reports headache, post nasal drip, sore throat and fever. Started 5 days ago. Headache was generalized, described as aching. No dizziness or visual changes. No difficulty swallowing. Denies runny nose, nasal congestion, ear pain, cough or SOB. Pt reports fever up to 100.6, denies chills or body aches. She denies nausea, vomiting or diarrhea. She has tried Ibuprofen, Tylenol and Alka Seltzer with minimal relief. She has not had sick contacts that she is aware of.     Observations/Objective: Alert and oriented. Can talk in complete sentences. No obvious congestion, hoarseness, cough or SOB.   Assessment and Plan:   Allergies/PND, Viral URI:  Get rest and drink plenty of fluids Start Zyrtec OTC Tylenol/Ibuprofen as needed for fever No concern for flu/COVID Work note provided  Follow Up Instructions:    I discussed the assessment and treatment plan with the patient. The patient was provided an opportunity to ask questions and all were answered. The patient agreed with the plan and demonstrated an understanding of the instructions.   The patient was advised to call back or seek an in-person evaluation if the symptoms worsen or if the condition fails to improve as anticipated.  I provided 8 minutes of non-face-to-face time during this encounter.   Webb Silversmith, NP

## 2018-12-08 ENCOUNTER — Other Ambulatory Visit: Payer: Self-pay | Admitting: Internal Medicine

## 2018-12-08 DIAGNOSIS — R350 Frequency of micturition: Secondary | ICD-10-CM

## 2018-12-09 NOTE — Telephone Encounter (Signed)
Last filled 07/2017 with 4 refills...please advise

## 2019-01-18 HISTORY — PX: AUGMENTATION MAMMAPLASTY: SUR837

## 2019-02-09 DIAGNOSIS — T8544XA Capsular contracture of breast implant, initial encounter: Secondary | ICD-10-CM | POA: Diagnosis not present

## 2019-11-22 ENCOUNTER — Other Ambulatory Visit: Payer: Self-pay | Admitting: Internal Medicine

## 2019-11-29 ENCOUNTER — Ambulatory Visit: Payer: BLUE CROSS/BLUE SHIELD | Admitting: Internal Medicine

## 2019-12-27 ENCOUNTER — Telehealth: Payer: Self-pay | Admitting: Internal Medicine

## 2019-12-27 NOTE — Telephone Encounter (Signed)
Called patient today to r/s appt on 6/10   She was rescheduled to the 28th but stated she will not have enough medication to make it until the appt  She would like to know if a refill could be sent for her.  She also stated that her insurance will only cover a 3 month supply    topiramate (TOPAMAX) 50 MG tablet

## 2019-12-27 NOTE — Telephone Encounter (Signed)
This medication has not been filled since 04/2018... please advise---pt was moved from tomorrow afternoon's schedule and rescheduled

## 2019-12-27 NOTE — Telephone Encounter (Signed)
Can we call the pharmacy and verify she has been taking. If so, okay to refill #90, 0 refill

## 2019-12-28 ENCOUNTER — Encounter: Payer: BC Managed Care – PPO | Admitting: Internal Medicine

## 2019-12-28 NOTE — Telephone Encounter (Cosign Needed)
Refill denied 

## 2019-12-28 NOTE — Telephone Encounter (Signed)
I called the pharmacy and they stated 10.2019 was the last filled Rx from them

## 2020-01-15 ENCOUNTER — Encounter: Payer: Self-pay | Admitting: Internal Medicine

## 2020-01-15 ENCOUNTER — Ambulatory Visit: Payer: BC Managed Care – PPO | Admitting: Internal Medicine

## 2020-01-15 ENCOUNTER — Other Ambulatory Visit: Payer: Self-pay

## 2020-01-15 VITALS — BP 106/72 | HR 71 | Temp 97.4°F | Ht 65.0 in | Wt 139.0 lb

## 2020-01-15 DIAGNOSIS — G43019 Migraine without aura, intractable, without status migrainosus: Secondary | ICD-10-CM | POA: Diagnosis not present

## 2020-01-15 DIAGNOSIS — Z1211 Encounter for screening for malignant neoplasm of colon: Secondary | ICD-10-CM

## 2020-01-15 DIAGNOSIS — E559 Vitamin D deficiency, unspecified: Secondary | ICD-10-CM | POA: Diagnosis not present

## 2020-01-15 DIAGNOSIS — Z0000 Encounter for general adult medical examination without abnormal findings: Secondary | ICD-10-CM

## 2020-01-15 DIAGNOSIS — N951 Menopausal and female climacteric states: Secondary | ICD-10-CM

## 2020-01-15 DIAGNOSIS — R350 Frequency of micturition: Secondary | ICD-10-CM | POA: Diagnosis not present

## 2020-01-15 MED ORDER — RIZATRIPTAN BENZOATE 10 MG PO TABS: 10.0000 mg | ORAL_TABLET | Freq: Three times a day (TID) | ORAL | 11 refills | Status: DC | PRN

## 2020-01-15 MED ORDER — TOPIRAMATE 50 MG PO TABS: 50.0000 mg | ORAL_TABLET | Freq: Two times a day (BID) | ORAL | 3 refills | Status: DC

## 2020-01-15 NOTE — Progress Notes (Signed)
Subjective:    Patient ID: Emily Dalton, female    DOB: [birth year], 49 y.o.   MRN: [id number]  HPI  Pt presents to the clinic today for her annual exam,  Migraines: These occur a few times per month. She takes Topamax daily and maxalt OTC with good relief of symptoms. She is not following with neurology.  Flu: 03/2019 Tetanus: 10/2010 Covid: never Pap Smear: hysterectomy Mammogram: 07/2018 Colon Screening: never Vision Screening: annually Dentist: biannually  Diet: She does eat meat. She consumes fruits and veggies daily. She tries to avoid fried foods. She drinks mostly water, coffee. Exercise: Home gym with weight bench, exercise bike  Review of Systems      Past Medical History:  Diagnosis Date  . Chicken pox   . Migraines     Current Outpatient Medications  Medication Sig Dispense Refill  . acetaminophen (TYLENOL) 500 MG tablet Take 500 mg by mouth as needed for headache.    . ibuprofen (ADVIL,MOTRIN) 200 MG tablet Take 800 mg by mouth as needed for headache.     . Magnesium 400 MG CAPS Take 1 capsule by mouth daily.    . rizatriptan (MAXALT) 10 MG tablet TAKE 1 TABLET (10 MG TOTAL) BY MOUTH 3 (THREE) TIMES DAILY AS NEEDED FOR MIGRAINE. 10 tablet 4  . topiramate (TOPAMAX) 50 MG tablet Take 1 tablet (50 mg total) by mouth 2 (two) times daily. (Patient taking differently: Take 50 mg by mouth 2 (two) times daily as needed. ) 180 tablet 1  . vitamin B-12 (CYANOCOBALAMIN) 1000 MCG tablet Take 1,000 mcg by mouth daily.    . Vitamin D, Ergocalciferol, (DRISDOL) 50000 units CAPS capsule Take 1 capsule (50,000 Units total) by mouth every 7 (seven) days. 12 capsule 0   No current facility-administered medications for this visit.    Allergies  Allergen Reactions  . Morphine And Related Other (See Comments)    Many years ago--does not remember     Family History  Problem Relation Age of Onset  . Hypertension Mother   . Diabetes Mother   . Arthritis Father   .  Hypertension Maternal Uncle   . Hypertension Maternal Grandmother   . Hypertension Maternal Grandfather   . Cancer Paternal Grandfather     Social History   Socioeconomic History  . Marital status: Married    Spouse name: Not on file  . Number of children: Not on file  . Years of education: Not on file  . Highest education level: Not on file  Occupational History  . Not on file  Tobacco Use  . Smoking status: Current Some Day Smoker    Types: E-cigarettes  . Smokeless tobacco: Never Used  . Tobacco comment: quit cigarettes 2014--now using e cigs  Substance and Sexual Activity  . Alcohol use: No    Alcohol/week: 0.0 standard drinks  . Drug use: No  . Sexual activity: Yes  Other Topics Concern  . Not on file  Social History Narrative  . Not on file   Social Determinants of Health   Financial Resource Strain:   . Difficulty of Paying Living Expenses:   Food Insecurity:   . Worried About Charity fundraiser in the Last Year:   . Arboriculturist in the Last Year:   Transportation Needs:   . Film/video editor (Medical):   Marland Kitchen Lack of Transportation (Non-Medical):   Physical Activity:   . Days of Exercise per Week:   . Minutes of Exercise  per Session:   Stress:   . Feeling of Stress :   Social Connections:   . Frequency of Communication with Friends and Family:   . Frequency of Social Gatherings with Friends and Family:   . Attends Religious Services:   . Active Member of Clubs or Organizations:   . Attends Archivist Meetings:   Marland Kitchen Marital Status:   Intimate Partner Violence:   . Fear of Current or Ex-Partner:   . Emotionally Abused:   Marland Kitchen Physically Abused:   . Sexually Abused:      Constitutional: Pt reports intermittent headaches. Denies fever, malaise, fatigue, or abrupt weight changes.  HEENT: Denies eye pain, eye redness, ear pain, ringing in the ears, wax buildup, runny nose, nasal congestion, bloody nose, or sore throat. Respiratory: Denies  difficulty breathing, shortness of breath, cough or sputum production.   Cardiovascular: Denies chest pain, chest tightness, palpitations or swelling in the hands or feet.  Gastrointestinal: Denies abdominal pain, bloating, constipation, diarrhea or blood in the stool.  GU: Denies urgency, frequency, pain with urination, burning sensation, blood in urine, odor or discharge. Musculoskeletal: Denies decrease in range of motion, difficulty with gait, muscle pain or joint pain and swelling.  Skin: Denies redness, rashes, lesions or ulcercations.  Neurological: Denies dizziness, difficulty with memory, difficulty with speech or problems with balance and coordination.  Psych: Denies anxiety, depression, SI/HI.  No other specific complaints in a complete review of systems (except as listed in HPI above).  Objective:   Physical Exam  BP 106/72   Pulse 71   Temp (!) 97.4 F (36.3 C) (Temporal)   Ht 5\' 5"  (1.651 m)   Wt 139 lb (63 kg)   SpO2 98%   BMI 23.13 kg/m   Wt Readings from Last 3 Encounters:  05/18/18 151 lb (68.5 kg)  05/05/18 149 lb (67.6 kg)  07/26/17 146 lb 12 oz (66.6 kg)    General: Appears her stated age, well developed, well nourished in NAD. Skin: Warm, dry and intact. No rashes noted. HEENT: Head: normal shape and size; Eyes: sclera white, no icterus, conjunctiva pink, PERRLA and EOMs intact;  Neck:  Neck supple, trachea midline. No masses, lumps or thyromegaly present.  Cardiovascular: Normal rate and rhythm. S1,S2 noted.  No murmur, rubs or gallops noted. No JVD or BLE edema.  Pulmonary/Chest: Normal effort and positive vesicular breath sounds. No respiratory distress. No wheezes, rales or ronchi noted.  Abdomen: Soft and nontender. Normal bowel sounds. No distention or masses noted. Liver, spleen and kidneys non palpable. Musculoskeletal: Strength 5/5 BUE/BLE. No difficulty with gait.  Neurological: Alert and oriented. Cranial nerves II-XII grossly intact.  Coordination normal.  Psychiatric: Mood and affect normal. Behavior is normal. Judgment and thought content normal.     BMET    Component Value Date/Time   NA 139 05/18/2018 1624   K 4.7 05/18/2018 1624   CL 106 05/18/2018 1624   CO2 28 05/18/2018 1624   GLUCOSE 82 05/18/2018 1624   BUN 12 05/18/2018 1624   CREATININE 0.80 05/18/2018 1624   CALCIUM 9.2 05/18/2018 1624    Lipid Panel     Component Value Date/Time   CHOL 209 (H) 05/18/2018 1624   TRIG 62.0 05/18/2018 1624   HDL 64.20 05/18/2018 1624   CHOLHDL 3 05/18/2018 1624   VLDL 12.4 05/18/2018 1624   LDLCALC 133 (H) 05/18/2018 1624    CBC    Component Value Date/Time   WBC 7.0 05/18/2018 1624  RBC 4.14 05/18/2018 1624   HGB 12.8 05/18/2018 1624   HCT 38.0 05/18/2018 1624   PLT 293.0 05/18/2018 1624   MCV 91.8 05/18/2018 1624   MCHC 33.7 05/18/2018 1624   RDW 13.4 05/18/2018 1624    Hgb A1C No results found for: HGBA1C          Assessment & Plan:   Preventative Health Maintenance:  Encouraged her to get a flu shot in the fall Tetanus due 2022 Encouraged her to get a covid vaccine She no longer needs pap smears She will schedule her mammogram when she is cleared by her surgeon Referral to GI for screenign colonoscopy Enocuraged her to consume a balanced diet and exercise regime Advised her to see an eye doctor and dentist annually Will check CBC, CMET, Lipid and Vit D today  RTC in 1 year, sooner if needed Webb Silversmith, NP This visit occurred during the SARS-CoV-2 public health emergency.  Safety protocols were in place, including screening questions prior to the visit, additional usage of staff PPE, and extensive cleaning of exam room while observing appropriate contact time as indicated for disinfecting solutions.

## 2020-01-15 NOTE — Patient Instructions (Signed)

## 2020-01-15 NOTE — Assessment & Plan Note (Signed)
Topamax and Maxalt refilled today

## 2020-01-16 LAB — COMPREHENSIVE METABOLIC PANEL
ALT: 12 U/L (ref 0–35)
AST: 16 U/L (ref 0–37)
Albumin: 4.4 g/dL (ref 3.5–5.2)
Alkaline Phosphatase: 47 U/L (ref 39–117)
BUN: 20 mg/dL (ref 6–23)
CO2: 23 mEq/L (ref 19–32)
Calcium: 9 mg/dL (ref 8.4–10.5)
Chloride: 106 mEq/L (ref 96–112)
Creatinine, Ser: 0.88 mg/dL (ref 0.40–1.20)
GFR: 68.2 mL/min (ref >60.00)
Glucose, Bld: 74 mg/dL (ref 70–99)
Potassium: 4.1 mEq/L (ref 3.5–5.1)
Sodium: 138 mEq/L (ref 135–145)
Total Bilirubin: 0.3 mg/dL (ref 0.2–1.2)
Total Protein: 6.6 g/dL (ref 6.0–8.3)

## 2020-01-16 LAB — LUTEINIZING HORMONE: LH: 13.64 m[IU]/mL

## 2020-01-16 LAB — LIPID PANEL
Cholesterol: 183 mg/dL (ref 0–200)
HDL: 52.1 mg/dL (ref >39.00)
LDL Cholesterol: 116 mg/dL — ABNORMAL HIGH (ref 0–99)
NonHDL: 130.56
Total CHOL/HDL Ratio: 4
Triglycerides: 73 mg/dL (ref 0.0–149.0)
VLDL: 14.6 mg/dL (ref 0.0–40.0)

## 2020-01-16 LAB — CBC
HCT: 38.5 % (ref 36.0–46.0)
Hemoglobin: 12.9 g/dL (ref 12.0–15.0)
MCHC: 33.6 g/dL (ref 30.0–36.0)
MCV: 92.9 fl (ref 78.0–100.0)
Platelets: 280 10*3/uL (ref 150.0–400.0)
RBC: 4.14 Mil/uL (ref 3.87–5.11)
RDW: 14 % (ref 11.5–15.5)
WBC: 7 10*3/uL (ref 4.0–10.5)

## 2020-01-16 LAB — VITAMIN D 25 HYDROXY (VIT D DEFICIENCY, FRACTURES): VITD: 36.87 ng/mL (ref 30.00–100.00)

## 2020-01-16 LAB — FOLLICLE STIMULATING HORMONE: FSH: 17 m[IU]/mL

## 2020-02-01 ENCOUNTER — Encounter: Payer: Self-pay | Admitting: Internal Medicine

## 2020-02-05 ENCOUNTER — Other Ambulatory Visit (INDEPENDENT_AMBULATORY_CARE_PROVIDER_SITE_OTHER): Payer: Self-pay

## 2020-02-05 ENCOUNTER — Telehealth (INDEPENDENT_AMBULATORY_CARE_PROVIDER_SITE_OTHER): Payer: Self-pay | Admitting: Gastroenterology

## 2020-02-05 DIAGNOSIS — Z1211 Encounter for screening for malignant neoplasm of colon: Secondary | ICD-10-CM

## 2020-02-05 MED ORDER — PEG 3350-KCL-NA BICARB-NACL 420 G PO SOLR: 4000.0000 mL | Freq: Once | ORAL | 0 refills | Status: AC

## 2020-02-05 NOTE — Progress Notes (Signed)
Gastroenterology Pre-Procedure Review  Request Date: Tuesday 03/05/20 Requesting Physician: Dr. Bonna Gains  PATIENT REVIEW QUESTIONS: The patient responded to the following health history questions as indicated:    1. Are you having any GI issues? no 2. Do you have a personal history of Polyps? no 3. Do you have a family history of Colon Cancer or Polyps? no 4. Diabetes Mellitus? no 5. Joint replacements in the past 12 months?Patient states 02/09/19 she had breast implant removal and breast lift 6. Major health problems in the past 3 months?no 7. Any artificial heart valves, MVP, or defibrillator?no    MEDICATIONS & ALLERGIES:    Patient reports the following regarding taking any anticoagulation/antiplatelet therapy:   Plavix, Coumadin, Eliquis, Xarelto, Lovenox, Pradaxa, Brilinta, or Effient? no Aspirin? no  Patient confirms/reports the following medications:  Current Outpatient Medications  Medication Sig Dispense Refill  . acetaminophen (TYLENOL) 500 MG tablet Take 500 mg by mouth as needed for headache.    . Calcium-Magnesium-Zinc 500-250-12.5 MG TABS Take by mouth.    . Cholecalciferol (VITAMIN D-3) 25 MCG (1000 UT) CAPS Take by mouth.    Mariane Baumgarten Calcium (STOOL SOFTENER PO) Take by mouth.    Marland Kitchen ibuprofen (ADVIL,MOTRIN) 200 MG tablet Take 800 mg by mouth as needed for headache.     . Magnesium 400 MG CAPS Take 1 capsule by mouth daily.    . rizatriptan (MAXALT) 10 MG tablet Take 1 tablet (10 mg total) by mouth 3 (three) times daily as needed for migraine. 10 tablet 11  . topiramate (TOPAMAX) 50 MG tablet Take 1 tablet (50 mg total) by mouth 2 (two) times daily. 180 tablet 3  . vitamin B-12 (CYANOCOBALAMIN) 1000 MCG tablet Take 1,000 mcg by mouth daily.    . polyethylene glycol-electrolytes (NULYTELY) 420 g solution Take 4,000 mLs by mouth once for 1 dose. 4000 mL 0   No current facility-administered medications for this visit.    Patient confirms/reports the following  allergies:  Allergies  Allergen Reactions  . Morphine And Related Other (See Comments)    Many years ago--does not remember     No orders of the defined types were placed in this encounter.   AUTHORIZATION INFORMATION Primary Insurance: 1D#: Group #:  Secondary Insurance: 1D#: Group #:  SCHEDULE INFORMATION: Date: Tuesday 03/05/20 Time: Location:[facility]

## 2020-02-21 ENCOUNTER — Telehealth: Payer: Self-pay

## 2020-02-21 NOTE — Telephone Encounter (Signed)
Patient has decided to cancel her colonoscopy at this time.  She stated she has too much going on at this time.  Referral has been noted.  Procedure has been canceled.  Trish in Endo notified.  Thanks,  [place], [place]

## 2020-02-28 ENCOUNTER — Other Ambulatory Visit: Payer: Self-pay

## 2020-02-28 ENCOUNTER — Ambulatory Visit: Payer: BC Managed Care – PPO | Admitting: Dermatology

## 2020-02-28 DIAGNOSIS — D485 Neoplasm of uncertain behavior of skin: Secondary | ICD-10-CM | POA: Diagnosis not present

## 2020-02-28 DIAGNOSIS — D225 Melanocytic nevi of trunk: Secondary | ICD-10-CM | POA: Diagnosis not present

## 2020-02-28 DIAGNOSIS — L578 Other skin changes due to chronic exposure to nonionizing radiation: Secondary | ICD-10-CM

## 2020-02-28 DIAGNOSIS — D1800 Hemangioma unspecified site: Secondary | ICD-10-CM | POA: Diagnosis not present

## 2020-02-28 DIAGNOSIS — L821 Other seborrheic keratosis: Secondary | ICD-10-CM

## 2020-02-28 DIAGNOSIS — Z1283 Encounter for screening for malignant neoplasm of skin: Secondary | ICD-10-CM | POA: Diagnosis not present

## 2020-02-28 DIAGNOSIS — D239 Other benign neoplasm of skin, unspecified: Secondary | ICD-10-CM

## 2020-02-28 DIAGNOSIS — D492 Neoplasm of unspecified behavior of bone, soft tissue, and skin: Secondary | ICD-10-CM

## 2020-02-28 DIAGNOSIS — L814 Other melanin hyperpigmentation: Secondary | ICD-10-CM

## 2020-02-28 DIAGNOSIS — D229 Melanocytic nevi, unspecified: Secondary | ICD-10-CM | POA: Diagnosis not present

## 2020-02-28 HISTORY — DX: Other benign neoplasm of skin, unspecified: D23.9

## 2020-02-28 NOTE — Patient Instructions (Signed)
Melanoma ABCDEs  Melanoma is the most dangerous type of skin cancer, and is the leading cause of death from skin disease.  You are more likely to develop melanoma if you:  Have light-colored skin, light-colored eyes, or red or blond hair  Spend a lot of time in the sun  Tan regularly, either outdoors or in a tanning bed  Have had blistering sunburns, especially during childhood  Have a close family member who has had a melanoma  Have atypical moles or large birthmarks  Early detection of melanoma is key since treatment is typically straightforward and cure rates are extremely high if we catch it early.   The first sign of melanoma is often a change in a mole or a new dark spot.  The ABCDE system is a way of remembering the signs of melanoma.  A for asymmetry:  The two halves do not match. B for border:  The edges of the growth are irregular. C for color:  A mixture of colors are present instead of an even brown color. D for diameter:  Melanomas are usually (but not always) greater than 36mm - the size of a pencil eraser. E for evolution:  The spot keeps changing in size, shape, and color.  Please check your skin once per month between visits. You can use a small mirror in front and a large mirror behind you to keep an eye on the back side or your body.   If you see any new or changing lesions before your next follow-up, please call to schedule a visit.  Please continue daily skin protection including broad spectrum sunscreen SPF 30+ to sun-exposed areas, reapplying every 2 hours as needed when you're outdoors.   Wound Care Instructions  On the day following your surgery, you should begin doing daily dressing changes: 1. Remove the old dressing and discard it. 2. Cleanse the wound gently with tap water. This may be done in the shower or by placing a wet gauze pad directly on the wound and letting it soak for several minutes. 3. It is important to gently remove any dried blood from  the wound in order to encourage healing. This may be done by gently rolling a moistened Q-tip on the dried blood. Do not pick at the wound. 4. If the wound should start to bleed, continue cleaning the wound, then place a moist gauze pad on the wound and hold pressure for a few minutes.  5. Make sure you then dry the skin surrounding the wound completely or the tape will not stick to the skin. Do not use cotton balls on the wound. 6. After the wound is clean and dry, apply the ointment gently with a Q-tip. 7. Cut a non-stick pad to fit the size of the wound. Lay the pad flush to the wound. If the wound is draining, you may want to reinforce it with a small amount of gauze on top of the non-stick pad for a little added compression to the area. 8. Use the tape to seal the area completely. 9. Select from the following with respect to your individual situation: 1. If your wound has been stitched closed: continue the above steps 1-8 at least daily until your sutures are removed. 2. If your wound has been left open to heal: continue steps 1-8 at least daily for the first 3-4 weeks. 10. We would like for you to take a few extra precautions for at least the next week. 1. Sleep with your head  elevated on pillows if our wound is on your head. 2. Do not bend over or lift heavy items to reduce the chance of elevated blood pressure to the wound 3. Do not participate in particularly strenuous activities.   Below is a list of dressing supplies you might need.  . Cotton-tipped applicators - Q-tips . Gauze pads (2x2 and/or 4x4) - All-Purpose Sponges . Non-stick dressing material - Telfa . Tape - Paper or Hypafix . New and clean tube of petroleum jelly - Vaseline    Comments on Post-Operative Period 1. Slight swelling and redness often appear around the wound. This is normal and will disappear within several days following the surgery. 2. The healing wound will drain a brownish-red-yellow discharge during  healing. This is a normal phase of wound healing. As the wound begins to heal, the drainage may increase in amount. Again, this drainage is normal. 3. Notify us if the drainage becomes persistently bloody, excessively swollen, or intensely painful or develops a foul odor or red streaks.  4. If you should experience mild discomfort during the healing phase, you may take an aspirin-free medication such as Tylenol (acetaminophen). Notify us if the discomfort is severe or persistent. Avoid alcoholic beverages when taking pain medicine.  In Case of Wound Hemorrhage A wound hemorrhage is when the bandage suddenly becomes soaked with bright red blood and flows profusely. If this happens, sit down or lie down with your head elevated. If the wound has a dressing on it, do not remove the dressing. Apply pressure to the existing gauze. If the wound is not covered, use a gauze pad to apply pressure and continue applying the pressure for 20 minutes without peeking. DO NOT COVER THE WOUND WITH A LARGE TOWEL OR [place] CLOTH. Release your hand from the wound site but do not remove the dressing. If the bleeding has stopped, gently clean around the wound. Leave the dressing in place for 24 hours if possible. This wait time allows the blood vessels to close off so that you do not spark a new round of bleeding by disrupting the newly clotted blood vessels with an immediate dressing change. If the bleeding does not subside, continue to hold pressure. If matters are out of your control, contact an After Hours clinic or go to the Emergency Room.

## 2020-02-28 NOTE — Progress Notes (Signed)
   New Patient Visit  Subjective  Emily Dalton is a 49 y.o. female who presents for the following: Annual Exam (New presents for a mole exam, no known hx of skin cancer). Pt recently moved here from [place].  The patient presents for Total-Body Skin Exam (TBSE) for skin cancer screening and mole check.  The following portions of the chart were reviewed this encounter and updated as appropriate:  Tobacco  Allergies  Meds  Problems  Med Hx  Surg Hx  Fam Hx     Review of Systems:  No other skin or systemic complaints except as noted in HPI or Assessment and Plan.  Objective  Well appearing patient in no apparent distress; mood and affect are within normal limits.  A full examination was performed including scalp, head, eyes, ears, nose, lips, neck, chest, axillae, abdomen, back, buttocks, bilateral upper extremities, bilateral lower extremities, hands, feet, fingers, toes, fingernails, and toenails. All findings within normal limits unless otherwise noted below.  Objective  L low back paraspinal: 0.4 cm dark brown irregular macule    Assessment & Plan    Lentigines - Scattered tan macules - Discussed due to sun exposure - Benign, observe - Call for any changes  Seborrheic Keratoses - Stuck-on, waxy, tan-brown papules and plaques  - Discussed benign etiology and prognosis. - Observe - Call for any changes  Melanocytic Nevi - Tan-brown and/or pink-flesh-colored symmetric macules and papules - Benign appearing on exam today - Observation - Call clinic for new or changing moles - Recommend daily use of broad spectrum spf 30+ sunscreen to sun-exposed areas.   Hemangiomas - Red papules - Discussed benign nature - Observe - Call for any changes  Actinic Damage - diffuse scaly erythematous macules with underlying dyspigmentation - Recommend daily broad spectrum sunscreen SPF 30+ to sun-exposed areas, reapply every 2 hours as needed.  - Call for new or changing  lesions.  Skin cancer screening performed today.  Neoplasm of skin L low back paraspinal  Skin / nail biopsy Type of biopsy: tangential   Informed consent: discussed and consent obtained   Patient was prepped and draped in usual sterile fashion: area prepped with alochol. Anesthesia: the lesion was anesthetized in a standard fashion   Anesthetic:  1% lidocaine w/ epinephrine 1-100,000 buffered w/ 8.4% NaHCO3 Instrument used: flexible razor blade   Hemostasis achieved with: pressure, aluminum chloride and electrodesiccation   Outcome: patient tolerated procedure well   Post-procedure details: wound care instructions given   Post-procedure details comment:  Ointment and small bandage  Specimen 1 - Surgical pathology Differential Diagnosis: R/O Dysplastic nevus  Check Margins: No 0.4 cm dark brown irregular macule  Return in about 1 year (around 02/27/2021) for TBSE .  IMarye Round, CMA, am acting as scribe for Sarina Ser, MD .  Documentation: I have reviewed the above documentation for accuracy and completeness, and I agree with the above.  Sarina Ser, MD

## 2020-03-04 ENCOUNTER — Encounter: Payer: Self-pay | Admitting: Dermatology

## 2020-03-05 ENCOUNTER — Ambulatory Visit: Admit: 2020-03-05 | Payer: BC Managed Care – PPO | Admitting: Gastroenterology

## 2020-03-05 SURGERY — COLONOSCOPY WITH PROPOFOL
Anesthesia: General

## 2020-03-07 ENCOUNTER — Telehealth: Payer: Self-pay

## 2020-03-07 NOTE — Telephone Encounter (Signed)
Pt came into the office with information from the insurance company... They stated that they did not cover vit d, or fsh/lsh because it can't have Dx for physical exam, must be Sx or Dx of deficiency... Do you know if you can addend the visit and additional Dx added so that it may be filed again under those Dx

## 2020-03-07 NOTE — Telephone Encounter (Signed)
Patient informed of pathology results and surgery scheduled. 

## 2020-03-07 NOTE — Telephone Encounter (Signed)
Note addended, waiting to hear back from billing to see if can be resubmitted to insurance.

## 2020-03-07 NOTE — Telephone Encounter (Signed)
-----   Message from Ralene Bathe, MD sent at 03/07/2020 12:58 PM EDT ----- Skin , left low back paraspinal DYSPLASTIC COMPOUND NEVUS WITH SEVERE ATYPIA, INFLAMED,  Severe dysplastic Schedule surgery

## 2020-03-20 ENCOUNTER — Telehealth: Payer: Self-pay

## 2020-03-20 NOTE — Telephone Encounter (Signed)
-----   Message from Ralene Bathe, MD sent at 03/11/2020  1:20 PM EDT ----- Regarding: RE: Patient would like to know how much she will have to pay for surgery 11402 + [id number] This is something typically covered by insurance (may be copay or coinsurance or deductible) ----- Message ----- From: Rosaria Ferries, CMA Sent: 03/11/2020  12:50 PM EDT To: Ralene Bathe, MD Subject: FW: Patient would like to know how much she #  Severe dysplastic nevus original size 0.4 cm L low back paraspinal - complex repair  ----- Message ----- From: Harvel Ricks Sent: 03/07/2020   4:58 PM EDT To: Rosaria Ferries, CMA Subject: RE: Patient would like to know how much she #  I need the lesion and estimated post defect size. Will it be an excision of benign lesion with complex or intermediate repair or other type of surgery?   ----- Message ----- From: Rosaria Ferries, CMA Sent: 03/07/2020   4:54 PM EDT To: Harvel Ricks Subject: Patient would like to know how much she will#  Patient is scheduled for surgery and would like to know the codes that will be used so she can verify cost with her insurance. Thanks!

## 2020-03-20 NOTE — Telephone Encounter (Signed)
Patient informed of requested procedure codes.

## 2020-04-23 ENCOUNTER — Encounter: Payer: Self-pay | Admitting: Internal Medicine

## 2020-04-24 NOTE — Telephone Encounter (Signed)
Have her schedule a virtual visit to discuss treatment options.

## 2020-04-25 ENCOUNTER — Telehealth (INDEPENDENT_AMBULATORY_CARE_PROVIDER_SITE_OTHER): Payer: BC Managed Care – PPO | Admitting: Family Medicine

## 2020-04-25 ENCOUNTER — Telehealth: Payer: Self-pay | Admitting: Internal Medicine

## 2020-04-25 ENCOUNTER — Encounter: Payer: Self-pay | Admitting: Family Medicine

## 2020-04-25 VITALS — BP 122/84 | HR 106 | Temp 98.2°F | Ht 65.0 in | Wt 128.0 lb

## 2020-04-25 DIAGNOSIS — R509 Fever, unspecified: Secondary | ICD-10-CM | POA: Diagnosis not present

## 2020-04-25 DIAGNOSIS — R430 Anosmia: Secondary | ICD-10-CM

## 2020-04-25 DIAGNOSIS — R059 Cough, unspecified: Secondary | ICD-10-CM | POA: Diagnosis not present

## 2020-04-25 MED ORDER — PREDNISONE 20 MG PO TABS: ORAL_TABLET | ORAL | 0 refills | Status: DC

## 2020-04-25 MED ORDER — ALBUTEROL SULFATE HFA 108 (90 BASE) MCG/ACT IN AERS
2.0000 | INHALATION_SPRAY | Freq: Four times a day (QID) | RESPIRATORY_TRACT | 0 refills | Status: DC | PRN
Start: 2020-04-25 — End: 2020-10-14

## 2020-04-25 NOTE — Progress Notes (Signed)
Emily Dalton T. Emily Cottone, MD Primary Care and [place] at [facility] [place], [address] Phone: [phone]  FAX: [phone]  Emily Dalton - 49 y.o. female  MRN [id number]  Date of Birth: 1971/06/25  Visit Date: 04/25/2020  PCP: Jearld Fenton, NP  Referred by: Jearld Fenton, NP Chief Complaint  Patient presents with  . Cough    with chest congestion-Sxs started 04/12/20-Pretty sure she had Covid but was not tested  . Fatigue  . Headache   Virtual Visit via Video Note:  I connected with  Emily Dalton on 04/25/2020  2:40 PM EDT by a video enabled telemedicine application and verified that I am speaking with the correct person using two identifiers.   Location patient: home computer, tablet, or smartphone Location provider: work or home office Consent: Verbal consent directly obtained from Emily Dalton. Persons participating in the virtual visit: patient, provider  I discussed the assessment and treatment plan with the patient. The patient was provided an opportunity to ask questions and all were answered. The patient agreed with the plan and demonstrated an understanding of the instructions..   History of Present Illness:  Emily Dalton presents today with some acute illness symptoms.  Concern for possible COVID-19.  Had a temp, in bed all day long.  Fever and could not get up. Then she went to work! Then Tuesday went home and her back hurt all over and had a fever.  Then not feeling well and very tired.  She did not get covid tested, but lost her smell.  She was sick for several days at work before going home, and her work has been alerted.  Her husband also has symptoms with loss of smell without testing.   She is at day 13 of symptoms, but no confirmation of covid. She is having some sob, but not debilitating  Immunization History  Administered Date(s) Administered  . Influenza,inj,Quad PF,6+ Mos 04/13/2019  .  Tdap 11/17/2010     Review of Systems as above: See pertinent positives and pertinent negatives per HPI No acute distress verbally   Observations/Objective/Exam:  An attempt was made to discern vital signs over the phone and per patient if applicable and possible.   General:    Alert, Oriented, appears well and in no acute distress  Pulmonary:     On inspection no signs of respiratory distress.  Psych / Neurological:     Pleasant and cooperative.  Assessment and Plan:    ICD-10-CM   1. Cough  R05.9   2. Fever, unspecified fever cause  R50.9   3. Sense of smell lost  R43.0    Coughing with loss of smell, fatigue, and some mild SOB.  Question of covid with no prior testing, but will treat with steroids and ventolin to help with respiratory symptoms and some sob  I discussed the assessment and treatment plan with the patient. The patient was provided an opportunity to ask questions and all were answered. The patient agreed with the plan and demonstrated an understanding of the instructions.   The patient was advised to call back or seek an in-person evaluation if the symptoms worsen or if the condition fails to improve as anticipated.  Follow-up: prn unless noted otherwise below No follow-ups on file.  Meds ordered this encounter  Medications  . predniSONE (DELTASONE) 20 MG tablet    Sig: 2 tabs po daily for 5 days, then 1 tab po daily  for 5 days    Dispense:  15 tablet    Refill:  0  . albuterol (VENTOLIN HFA) 108 (90 Base) MCG/ACT inhaler    Sig: Inhale 2 puffs into the lungs every 6 (six) hours as needed for wheezing or shortness of breath.    Dispense:  8 g    Refill:  0   No orders of the defined types were placed in this encounter.   Signed,  Emily Deed. Shalunda Lindh, MD

## 2020-04-25 NOTE — Telephone Encounter (Signed)
Patient was called and she refused to have a virtual visit. She states that she wants to be listened to and would like to be actually seen. She states that she would go to a clinic to be seen.

## 2020-04-25 NOTE — Telephone Encounter (Signed)
noted 

## 2020-04-26 DIAGNOSIS — Z20822 Contact with and (suspected) exposure to covid-19: Secondary | ICD-10-CM | POA: Diagnosis not present

## 2020-04-30 ENCOUNTER — Telehealth: Payer: Self-pay

## 2020-04-30 ENCOUNTER — Ambulatory Visit: Payer: BC Managed Care – PPO | Admitting: Dermatology

## 2020-04-30 ENCOUNTER — Encounter: Payer: Self-pay | Admitting: Dermatology

## 2020-04-30 ENCOUNTER — Other Ambulatory Visit: Payer: Self-pay

## 2020-04-30 DIAGNOSIS — D239 Other benign neoplasm of skin, unspecified: Secondary | ICD-10-CM

## 2020-04-30 DIAGNOSIS — D492 Neoplasm of unspecified behavior of bone, soft tissue, and skin: Secondary | ICD-10-CM

## 2020-04-30 DIAGNOSIS — D485 Neoplasm of uncertain behavior of skin: Secondary | ICD-10-CM | POA: Diagnosis not present

## 2020-04-30 DIAGNOSIS — D235 Other benign neoplasm of skin of trunk: Secondary | ICD-10-CM | POA: Diagnosis not present

## 2020-04-30 DIAGNOSIS — L988 Other specified disorders of the skin and subcutaneous tissue: Secondary | ICD-10-CM | POA: Diagnosis not present

## 2020-04-30 MED ORDER — MUPIROCIN 2 % EX OINT: 1.0000 "application " | TOPICAL_OINTMENT | Freq: Every day | CUTANEOUS | 1 refills | Status: DC

## 2020-04-30 NOTE — Patient Instructions (Signed)

## 2020-04-30 NOTE — Telephone Encounter (Signed)
Left message for patient regarding today's surgery/hd 

## 2020-04-30 NOTE — Progress Notes (Signed)
   Follow-Up Visit   Subjective  Emily Dalton is a 49 y.o. female who presents for the following: Procedure (Biopsy proven severe dysplastic nevus of left low back paraspinal - Excise today).  The following portions of the chart were reviewed this encounter and updated as appropriate:  Tobacco  Allergies  Meds  Problems  Med Hx  Surg Hx  Fam Hx     Review of Systems:  No other skin or systemic complaints except as noted in HPI or Assessment and Plan.  Objective  Well appearing patient in no apparent distress; mood and affect are within normal limits.  A focused examination was performed including back, abdomen, breast. Relevant physical exam findings are noted in the Assessment and Plan.  Objective  L low back paraspinal: Pink bx site 0.5 x 0.4cm  Objective  L abdomen, R breast, R shoulder, R umbilicus: Irregular brown macules   Assessment & Plan  Dysplastic nevus L low back paraspinal  Skin excision - L low back paraspinal  Lesion length (cm):  0.5 Lesion width (cm):  0.4 Margin per side (cm):  0.2 Total excision diameter (cm):  0.9 Informed consent: discussed and consent obtained   Timeout: patient name, date of birth, surgical site, and procedure verified   Procedure prep:  Patient was prepped and draped in usual sterile fashion Prep type:  Isopropyl alcohol and povidone-iodine Anesthesia: the lesion was anesthetized in a standard fashion   Anesthetic:  1% lidocaine w/ epinephrine 1-100,000 buffered w/ 8.4% NaHCO3 (10.0cc) Instrument used: #15 blade   Hemostasis achieved with: pressure   Hemostasis achieved with comment:  Electrocautery Outcome: patient tolerated procedure well with no complications   Post-procedure details: sterile dressing applied and wound care instructions given   Dressing type: bandage and pressure dressing (Mupirocin)    Skin repair - L low back paraspinal Complexity:  Complex Final length (cm):  3 Reason for type of repair: reduce  tension to allow closure, reduce the risk of dehiscence, infection, and necrosis, reduce subcutaneous dead space and avoid a hematoma, allow closure of the large defect, preserve normal anatomy, preserve normal anatomical and functional relationships and enhance both functionality and cosmetic results   Undermining: area extensively undermined   Undermining comment:  Undermining Defect 0.9cm Subcutaneous layers (deep stitches):  Suture size:  3-0 Suture type: Vicryl (polyglactin 910)   Subcutaneous suture technique: Inverted Dermal. Fine/surface layer approximation (top stitches):  Suture size:  4-0 Suture type: nylon   Stitches: simple running   Suture removal (days):  7 Hemostasis achieved with: pressure Outcome: patient tolerated procedure well with no complications   Post-procedure details: sterile dressing applied and wound care instructions given   Dressing type: bandage, pressure dressing and bacitracin (Mupirocin)    mupirocin ointment (BACTROBAN) 2 % - L low back paraspinal  Specimen 1 - Surgical pathology Differential Diagnosis: D48.5 Dysplastic Nevus Check Margins: yes Pink bx site 0.5 x 0.4cm [id number]  Neoplasm of skin L abdomen, R breast, R shoulder, R umbilicus  Nevus vs Dysplastic Nevus, plan bx/shave removal x 4 on f/u  Return in about 1 week (around 05/07/2020) for suture removal and bx/shave removal x 4.  I, Sonya Hupman, RMA, am acting as scribe for Sarina Ser, MD .  Documentation: I have reviewed the above documentation for accuracy and completeness, and I agree with the above.  Sarina Ser, MD

## 2020-05-01 ENCOUNTER — Encounter: Payer: Self-pay | Admitting: Dermatology

## 2020-05-07 ENCOUNTER — Ambulatory Visit: Payer: BC Managed Care – PPO | Admitting: Dermatology

## 2020-05-07 ENCOUNTER — Other Ambulatory Visit: Payer: Self-pay

## 2020-05-07 DIAGNOSIS — D2261 Melanocytic nevi of right upper limb, including shoulder: Secondary | ICD-10-CM | POA: Diagnosis not present

## 2020-05-07 DIAGNOSIS — D225 Melanocytic nevi of trunk: Secondary | ICD-10-CM | POA: Diagnosis not present

## 2020-05-07 DIAGNOSIS — D485 Neoplasm of uncertain behavior of skin: Secondary | ICD-10-CM

## 2020-05-07 DIAGNOSIS — Z86018 Personal history of other benign neoplasm: Secondary | ICD-10-CM

## 2020-05-08 NOTE — Progress Notes (Signed)
Follow-Up Visit   Subjective  Emily Dalton is a 49 y.o. female who presents for the following: Follow-up (Post op of severe dysplastic nevus margins free of - suture removal today.) and Other (Irregular nevi x 4 of - shave removal today).  The following portions of the chart were reviewed this encounter and updated as appropriate:  Tobacco  Allergies  Meds  Problems  Med Hx  Surg Hx  Fam Hx     Review of Systems:  No other skin or systemic complaints except as noted in HPI or Assessment and Plan.  Objective  Well appearing patient in no apparent distress; mood and affect are within normal limits.  All skin waist up examined.  Objective  Left epigastric: 0.6 cm irregular brown macule  Objective  Right Breast: 0.7 cm irregular brown macule  Objective  Right Shoulder: 0.7 cm irregular brown macule  Objective  Right umbilicus: 0.6 cm irregular brown macule  Objective  Left low back paraspinal: Healing excision site.   Assessment & Plan  Neoplasm of uncertain behavior of skin (4) Left epigastric  Epidermal / dermal shaving  Lesion diameter (cm):  0.6 Informed consent: discussed and consent obtained   Timeout: patient name, date of birth, surgical site, and procedure verified   Procedure prep:  Patient was prepped and draped in usual sterile fashion Prep type:  Isopropyl alcohol Anesthesia: the lesion was anesthetized in a standard fashion   Anesthetic:  1% lidocaine w/ epinephrine 1-100,000 buffered w/ 8.4% NaHCO3 Instrument used: flexible razor blade   Hemostasis achieved with: pressure, aluminum chloride and electrodesiccation   Outcome: patient tolerated procedure well   Post-procedure details: sterile dressing applied and wound care instructions given   Dressing type: bandage and petrolatum    Specimen 1 - Surgical pathology Differential Diagnosis: *Nevus vs dysplastic nevus Check Margins: No 0.6 cm irregular brown macule  Right  Breast  Epidermal / dermal shaving  Lesion diameter (cm):  0.7 Informed consent: discussed and consent obtained   Timeout: patient name, date of birth, surgical site, and procedure verified   Procedure prep:  Patient was prepped and draped in usual sterile fashion Prep type:  Isopropyl alcohol Anesthesia: the lesion was anesthetized in a standard fashion   Anesthetic:  1% lidocaine w/ epinephrine 1-100,000 buffered w/ 8.4% NaHCO3 Instrument used: flexible razor blade   Hemostasis achieved with: pressure, aluminum chloride and electrodesiccation   Outcome: patient tolerated procedure well   Post-procedure details: sterile dressing applied and wound care instructions given   Dressing type: bandage and petrolatum    Specimen 2 - Surgical pathology Differential Diagnosis: Nevus vs dysplastic nevus Check Margins: No 0.7 cm irregular brown macule  Right Shoulder  Epidermal / dermal shaving  Lesion diameter (cm):  0.7 Informed consent: discussed and consent obtained   Timeout: patient name, date of birth, surgical site, and procedure verified   Procedure prep:  Patient was prepped and draped in usual sterile fashion Prep type:  Isopropyl alcohol Anesthesia: the lesion was anesthetized in a standard fashion   Anesthetic:  1% lidocaine w/ epinephrine 1-100,000 buffered w/ 8.4% NaHCO3 Instrument used: flexible razor blade   Hemostasis achieved with: pressure, aluminum chloride and electrodesiccation   Outcome: patient tolerated procedure well   Post-procedure details: sterile dressing applied and wound care instructions given   Dressing type: bandage and petrolatum    Specimen 3 - Surgical pathology Differential Diagnosis: Nevus vs dysplastic nevus Check Margins: No 0.7 cm irregular brown macule  Right umbilicus  Epidermal / dermal shaving  Lesion diameter (cm):  0.6 Informed consent: discussed and consent obtained   Timeout: patient name, date of birth, surgical site, and  procedure verified   Procedure prep:  Patient was prepped and draped in usual sterile fashion Prep type:  Isopropyl alcohol Anesthesia: the lesion was anesthetized in a standard fashion   Anesthetic:  1% lidocaine w/ epinephrine 1-100,000 buffered w/ 8.4% NaHCO3 Instrument used: flexible razor blade   Hemostasis achieved with: pressure, aluminum chloride and electrodesiccation   Outcome: patient tolerated procedure well   Post-procedure details: sterile dressing applied and wound care instructions given   Dressing type: bandage and petrolatum    Specimen 4 - Surgical pathology Differential Diagnosis: Nevus vs dysplastic nevus  Check Margins: No 0.6 cm irregular brown macule  History of dysplastic nevus Left low back paraspinal  Wound cleansed, sutures removed, wound cleansed and steri strips applied. Discussed pathology results.   Return in about 6 months (around 11/05/2020).   I, Ashok Cordia, CMA, am acting as scribe for Sarina Ser, MD .  Documentation: I have reviewed the above documentation for accuracy and completeness, and I agree with the above.  Sarina Ser, MD

## 2020-05-08 NOTE — Patient Instructions (Signed)

## 2020-05-13 ENCOUNTER — Telehealth: Payer: Self-pay

## 2020-05-13 ENCOUNTER — Encounter: Payer: Self-pay | Admitting: Dermatology

## 2020-05-13 NOTE — Telephone Encounter (Signed)
Advised patient of results/hd  

## 2020-05-13 NOTE — Telephone Encounter (Signed)
-----   Message from Ralene Bathe, MD sent at 05/13/2020 10:37 AM EDT ----- 1. Skin , left epigastric DYSPLASTIC COMPOUND NEVUS WITH MILD ATYPIA, CLOSE TO MARGIN 2. Skin , right breast DYSPLASTIC COMPOUND NEVUS WITH MILD ATYPIA, CLOSE TO MARGIN 3. Skin , right shoulder DYSPLASTIC COMPOUND NEVUS WITH MILD ATYPIA, CLOSE TO MARGIN 4. Skin , right umbilicus DYSPLASTIC JUNCTIONAL NEVUS WITH MILD ATYPIA, CLOSE TO MARGIN  1, 2, 3, 4 - all 4 Dysplastic Mild Recheck next visit Keep appt

## 2020-10-09 DIAGNOSIS — J00 Acute nasopharyngitis [common cold]: Secondary | ICD-10-CM | POA: Diagnosis not present

## 2020-10-09 DIAGNOSIS — R059 Cough, unspecified: Secondary | ICD-10-CM | POA: Diagnosis not present

## 2020-10-14 ENCOUNTER — Telehealth (INDEPENDENT_AMBULATORY_CARE_PROVIDER_SITE_OTHER): Payer: BC Managed Care – PPO | Admitting: Internal Medicine

## 2020-10-14 ENCOUNTER — Other Ambulatory Visit: Payer: Self-pay

## 2020-10-14 ENCOUNTER — Encounter: Payer: Self-pay | Admitting: Internal Medicine

## 2020-10-14 DIAGNOSIS — J00 Acute nasopharyngitis [common cold]: Secondary | ICD-10-CM | POA: Diagnosis not present

## 2020-10-14 MED ORDER — AZITHROMYCIN 250 MG PO TABS: ORAL_TABLET | ORAL | 0 refills | Status: DC

## 2020-10-14 MED ORDER — HYDROCOD POLST-CPM POLST ER 10-8 MG/5ML PO SUER: 5.0000 mL | Freq: Every evening | ORAL | 0 refills | Status: DC | PRN

## 2020-10-14 NOTE — Patient Instructions (Signed)

## 2020-10-14 NOTE — Progress Notes (Signed)
Virtual Visit via Video Note  I connected with Emily Dalton on 10/14/20 at 11:15 AM EDT by a video enabled telemedicine application and verified that I am speaking with the correct person using two identifiers.  Location: Patient: Home Provider: Office  Person's participating in this video call: Webb Silversmith, NP-C and Maelin Kurkowski   I discussed the limitations of evaluation and management by telemedicine and the availability of in person appointments. The patient expressed understanding and agreed to proceed.  History of Present Illness:   Pt reports runny nose, post nasal drip, sore throat and cough. She reports this started 1 week ago. She is blowing clear mucous out of her nose. She denies difficulty swallowing. The cough is productive of green mucous. She denies headache, nasal congestion, ear pain or SOB. She did run a fever 1 week ago but denies chills or body aches. She has tried Mucinex, Cold and Flu medicine, cough syrup, allergy meds OTC with minimal relief of symptoms. [facility], refused Covid testing. Diagnosed with a viral URI with cough, given RX for Tessalon Pearls which she does not feel like helped.  Past Medical History:  Diagnosis Date  . Chicken pox   . Dysplastic nevus 02/28/2020   left low back paraspinal, excised 04/30/20  . Dysplastic nevus 05/39/7673   right umbilicus, right shoulder, right breast, right epigastric  . Migraines     Current Outpatient Medications  Medication Sig Dispense Refill  . acetaminophen (TYLENOL) 500 MG tablet Take 500 mg by mouth as needed for headache.    . albuterol (VENTOLIN HFA) 108 (90 Base) MCG/ACT inhaler Inhale 2 puffs into the lungs every 6 (six) hours as needed for wheezing or shortness of breath. 8 g 0  . Calcium-Magnesium-Zinc 500-250-12.5 MG TABS Take by mouth.    . Cholecalciferol (VITAMIN D-3) 25 MCG (1000 UT) CAPS Take by mouth.    Mariane Baumgarten Calcium (STOOL SOFTENER PO) Take by mouth.     Marland Kitchen ibuprofen (ADVIL,MOTRIN) 200 MG tablet Take 800 mg by mouth as needed for headache.     . Magnesium 400 MG CAPS Take 1 capsule by mouth daily.    . mupirocin ointment (BACTROBAN) 2 % Apply 1 application topically daily. Qd to excision site 22 g 1  . predniSONE (DELTASONE) 20 MG tablet 2 tabs po daily for 5 days, then 1 tab po daily for 5 days 15 tablet 0  . rizatriptan (MAXALT) 10 MG tablet Take 1 tablet (10 mg total) by mouth 3 (three) times daily as needed for migraine. 10 tablet 11  . topiramate (TOPAMAX) 50 MG tablet Take 1 tablet (50 mg total) by mouth 2 (two) times daily. 180 tablet 3  . vitamin B-12 (CYANOCOBALAMIN) 1000 MCG tablet Take 1,000 mcg by mouth daily.     No current facility-administered medications for this visit.    Allergies  Allergen Reactions  . Morphine And Related Other (See Comments)    Many years ago--does not remember     Family History  Problem Relation Age of Onset  . Hypertension Mother   . Diabetes Mother   . Arthritis Father   . Hypertension Maternal Uncle   . Hypertension Maternal Grandmother   . Hypertension Maternal Grandfather   . Cancer Paternal Grandfather     Social History   Socioeconomic History  . Marital status: Married    Spouse name: Not on file  . Number of children: Not on file  . Years of  education: Not on file  . Highest education level: Not on file  Occupational History  . Not on file  Tobacco Use  . Smoking status: Former Smoker    Types: E-cigarettes  . Smokeless tobacco: Never Used  . Tobacco comment: 01/2019  Substance and Sexual Activity  . Alcohol use: No    Alcohol/week: 0.0 standard drinks  . Drug use: No  . Sexual activity: Yes  Other Topics Concern  . Not on file  Social History Narrative  . Not on file   Social Determinants of Health   Financial Resource Strain: Not on file  Food Insecurity: Not on file  Transportation Needs: Not on file  Physical Activity: Not on file  Stress: Not on file   Social Connections: Not on file  Intimate Partner Violence: Not on file     Constitutional: Pt reports fever. Denies malaise, fatigue, headache or abrupt weight changes.  HEENT: Pt reports runny nose, post nasal drip, sore throat. Denies eye pain, eye redness, ear pain, ringing in the ears, wax buildup, nasal congestion, bloody nose. Respiratory: Pt reports cough. Denies difficulty breathing, shortness of breathn.   Cardiovascular: Denies chest pain, chest tightness, palpitations or swelling in the hands or feet.  Gastrointestinal: Denies abdominal pain, bloating, constipation, diarrhea or blood in the stool.    No other specific complaints in a complete review of systems (except as listed in HPI above).  Observations/Objective: There were no vitals taken for this visit. Wt Readings from Last 3 Encounters:  04/25/20 128 lb (58.1 kg)  01/15/20 139 lb (63 kg)  05/18/18 151 lb (68.5 kg)    General: In NAD. HEENT: Head: normal shape and size; Nose: no congestion noted ; Throat/Mouth: hoarseness noted Pulmonary/Chest: Normal effort. No respiratory distress.  Neurological: Alert and oriented.  BMET    Component Value Date/Time   NA 138 01/15/2020 1545   K 4.1 01/15/2020 1545   CL 106 01/15/2020 1545   CO2 23 01/15/2020 1545   GLUCOSE 74 01/15/2020 1545   BUN 20 01/15/2020 1545   CREATININE 0.88 01/15/2020 1545   CALCIUM 9.0 01/15/2020 1545    Lipid Panel     Component Value Date/Time   CHOL 183 01/15/2020 1545   TRIG 73.0 01/15/2020 1545   HDL 52.10 01/15/2020 1545   CHOLHDL 4 01/15/2020 1545   VLDL 14.6 01/15/2020 1545   LDLCALC 116 (H) 01/15/2020 1545    CBC    Component Value Date/Time   WBC 7.0 01/15/2020 1545   RBC 4.14 01/15/2020 1545   HGB 12.9 01/15/2020 1545   HCT 38.5 01/15/2020 1545   PLT 280.0 01/15/2020 1545   MCV 92.9 01/15/2020 1545   MCHC 33.6 01/15/2020 1545   RDW 14.0 01/15/2020 1545    Hgb A1C No results found for:  HGBA1C      Assessment and Plan:  Upper Respiratory Infection:  Continue allergy meds RX for Azithromycin 250 mg PO x 5 days RX for Tussionex for cough  If no improvement by Friday, please update me Follow Up Instructions:    I discussed the assessment and treatment plan with the patient. The patient was provided an opportunity to ask questions and all were answered. The patient agreed with the plan and demonstrated an understanding of the instructions.   The patient was advised to call back or seek an in-person evaluation if the symptoms worsen or if the condition fails to improve as anticipated.    Webb Silversmith, NP

## 2020-11-04 ENCOUNTER — Ambulatory Visit: Payer: BC Managed Care – PPO | Admitting: Dermatology

## 2020-12-04 ENCOUNTER — Other Ambulatory Visit: Payer: Self-pay

## 2020-12-04 ENCOUNTER — Encounter: Payer: Self-pay | Admitting: Dermatology

## 2020-12-04 ENCOUNTER — Ambulatory Visit: Payer: BC Managed Care – PPO | Admitting: Dermatology

## 2020-12-04 DIAGNOSIS — D1800 Hemangioma unspecified site: Secondary | ICD-10-CM

## 2020-12-04 DIAGNOSIS — Z86018 Personal history of other benign neoplasm: Secondary | ICD-10-CM | POA: Diagnosis not present

## 2020-12-04 DIAGNOSIS — L578 Other skin changes due to chronic exposure to nonionizing radiation: Secondary | ICD-10-CM | POA: Diagnosis not present

## 2020-12-04 DIAGNOSIS — L814 Other melanin hyperpigmentation: Secondary | ICD-10-CM

## 2020-12-04 DIAGNOSIS — D2272 Melanocytic nevi of left lower limb, including hip: Secondary | ICD-10-CM | POA: Diagnosis not present

## 2020-12-04 DIAGNOSIS — L821 Other seborrheic keratosis: Secondary | ICD-10-CM

## 2020-12-04 DIAGNOSIS — D225 Melanocytic nevi of trunk: Secondary | ICD-10-CM | POA: Diagnosis not present

## 2020-12-04 DIAGNOSIS — Z1283 Encounter for screening for malignant neoplasm of skin: Secondary | ICD-10-CM

## 2020-12-04 DIAGNOSIS — D485 Neoplasm of uncertain behavior of skin: Secondary | ICD-10-CM

## 2020-12-04 DIAGNOSIS — L739 Follicular disorder, unspecified: Secondary | ICD-10-CM | POA: Diagnosis not present

## 2020-12-04 DIAGNOSIS — D229 Melanocytic nevi, unspecified: Secondary | ICD-10-CM

## 2020-12-04 MED ORDER — CLINDAMYCIN PHOSPHATE 1 % EX LOTN: TOPICAL_LOTION | CUTANEOUS | 0 refills | Status: DC

## 2020-12-04 NOTE — Patient Instructions (Signed)

## 2020-12-04 NOTE — Progress Notes (Signed)
Follow-Up Visit   Subjective  Emily Dalton is a 50 y.o. female who presents for the following: Annual Exam (Hx dysplastic nevi ). The patient presents for Total-Body Skin Exam (TBSE) for skin cancer screening and mole check.  The following portions of the chart were reviewed this encounter and updated as appropriate:   Tobacco  Allergies  Meds  Problems  Med Hx  Surg Hx  Fam Hx     Review of Systems:  No other skin or systemic complaints except as noted in HPI or Assessment and Plan.  Objective  Well appearing patient in no apparent distress; mood and affect are within normal limits.  A full examination was performed including scalp, head, eyes, ears, nose, lips, neck, chest, axillae, abdomen, back, buttocks, bilateral upper extremities, bilateral lower extremities, hands, feet, fingers, toes, fingernails, and toenails. All findings within normal limits unless otherwise noted below.  Objective  Chest: Follicular based papules.   Objective  L mid side: 0.3 cm irregular brown macule.  Objective  L ant mid to distal thigh: 0.5 cm irregular brown macule.   Assessment & Plan  Folliculitis Chest Chronic and persistent by history -  Start Clindamycin lotion apply to aa QHS.  clindamycin (CLEOCIN-T) 1 % lotion - Chest  Neoplasm of uncertain behavior of skin (2) L mid side Epidermal / dermal shaving  Lesion diameter (cm):  0.3 Informed consent: discussed and consent obtained   Timeout: patient name, date of birth, surgical site, and procedure verified   Procedure prep:  Patient was prepped and draped in usual sterile fashion Prep type:  Isopropyl alcohol Anesthesia: the lesion was anesthetized in a standard fashion   Anesthetic:  1% lidocaine w/ epinephrine 1-100,000 buffered w/ 8.4% NaHCO3 Instrument used: flexible razor blade   Hemostasis achieved with: pressure, aluminum chloride and electrodesiccation   Outcome: patient tolerated procedure well   Post-procedure  details: sterile dressing applied and wound care instructions given   Dressing type: bandage and petrolatum    Specimen 1 - Surgical pathology Differential Diagnosis: D48.5 r/o dysplastic nevus  Check Margins: No 0.3 cm irregular brown macule.  L ant mid to distal thigh Epidermal / dermal shaving  Lesion diameter (cm):  0.5 Informed consent: discussed and consent obtained   Timeout: patient name, date of birth, surgical site, and procedure verified   Procedure prep:  Patient was prepped and draped in usual sterile fashion Prep type:  Isopropyl alcohol Anesthesia: the lesion was anesthetized in a standard fashion   Anesthetic:  1% lidocaine w/ epinephrine 1-100,000 buffered w/ 8.4% NaHCO3 Instrument used: flexible razor blade   Hemostasis achieved with: pressure, aluminum chloride and electrodesiccation   Outcome: patient tolerated procedure well   Post-procedure details: sterile dressing applied and wound care instructions given   Dressing type: bandage and petrolatum    Specimen 2 - Surgical pathology Differential Diagnosis: D48.5 r/o dysplastic nevus  Check Margins: No 0.5 cm irregular brown macule.   Lentigines - Scattered tan macules - Due to sun exposure - Benign-appering, observe - Recommend daily broad spectrum sunscreen SPF 30+ to sun-exposed areas, reapply every 2 hours as needed. - Call for any changes  Seborrheic Keratoses - Stuck-on, waxy, tan-brown papules and/or plaques  - Benign-appearing - Discussed benign etiology and prognosis. - Observe - Call for any changes  Melanocytic Nevi - Tan-brown and/or pink-flesh-colored symmetric macules and papules - Benign appearing on exam today - Observation - Call clinic for new or changing moles - Recommend daily use of broad  spectrum spf 30+ sunscreen to sun-exposed areas.   Hemangiomas - Red papules - Discussed benign nature - Observe - Call for any changes  Actinic Damage - Chronic condition, secondary to  cumulative UV/sun exposure - diffuse scaly erythematous macules with underlying dyspigmentation - Recommend daily broad spectrum sunscreen SPF 30+ to sun-exposed areas, reapply every 2 hours as needed.  - Staying in the shade or wearing long sleeves, sun glasses (UVA+UVB protection) and wide brim hats (4-inch brim around the entire circumference of the hat) are also recommended for sun protection.  - Call for new or changing lesions.  History of Dysplastic Nevi - No evidence of recurrence today - Recommend regular full body skin exams - Recommend daily broad spectrum sunscreen SPF 30+ to sun-exposed areas, reapply every 2 hours as needed.  - Call if any new or changing lesions are noted between office visits  Skin cancer screening performed today.  Return in about 6 months (around 06/06/2021) for TBSE.  Luther Redo, CMA, am acting as scribe for Sarina Ser, MD .  Documentation: I have reviewed the above documentation for accuracy and completeness, and I agree with the above.  Sarina Ser, MD

## 2020-12-10 ENCOUNTER — Encounter: Payer: Self-pay | Admitting: Dermatology

## 2020-12-10 ENCOUNTER — Telehealth: Payer: Self-pay

## 2020-12-10 NOTE — Telephone Encounter (Signed)
-----   Message from Ralene Bathe, MD sent at 12/10/2020  9:50 AM EDT ----- Diagnosis 1. Skin , left mid side DYSPLASTIC JUNCTIONAL NEVUS WITH MODERATE ATYPIA, CLOSE TO MARGIN 2. Skin , left ant mid to distal thigh DYSPLASTIC JUNCTIONAL NEVUS WITH SEVERE ATYPIA, CLOSE TO MARGIN, SEE DESCRIPTION  1- moderate dysplastic Margins clear but "close to margin" Recheck next visit 2- Severe dysplastic Margins clear but "close to margin" May need additional procedure Recheck next visit

## 2020-12-10 NOTE — Telephone Encounter (Signed)
Discussed biopsy results with pt  °

## 2021-01-06 ENCOUNTER — Other Ambulatory Visit: Payer: Self-pay | Admitting: Internal Medicine

## 2021-01-06 NOTE — Telephone Encounter (Signed)
Requested medication (s) are due for refill today:  Yes  Requested medication (s) are on the active medication list:   Yes  Future visit scheduled:   Yes Webb Silversmith 01/21/2021   Last ordered: 01/15/2020 #180, 3 refills  Returned because this is a non delegated refill    Requested Prescriptions  Pending Prescriptions Disp Refills   topiramate (TOPAMAX) 50 MG tablet [Pharmacy Med Name: TOPIRAMATE 50 MG TABLET] 180 tablet 3    Sig: TAKE 1 TABLET BY MOUTH TWICE A DAY      There is no refill protocol information for this order

## 2021-01-21 ENCOUNTER — Ambulatory Visit (INDEPENDENT_AMBULATORY_CARE_PROVIDER_SITE_OTHER): Payer: BC Managed Care – PPO | Admitting: Internal Medicine

## 2021-01-21 ENCOUNTER — Encounter: Payer: Self-pay | Admitting: Internal Medicine

## 2021-01-21 ENCOUNTER — Other Ambulatory Visit: Payer: Self-pay

## 2021-01-21 VITALS — BP 108/55 | HR 63 | Temp 97.5°F | Resp 16 | Ht 65.0 in | Wt 147.0 lb

## 2021-01-21 DIAGNOSIS — K5909 Other constipation: Secondary | ICD-10-CM

## 2021-01-21 DIAGNOSIS — Z1231 Encounter for screening mammogram for malignant neoplasm of breast: Secondary | ICD-10-CM | POA: Diagnosis not present

## 2021-01-21 DIAGNOSIS — Z0001 Encounter for general adult medical examination with abnormal findings: Secondary | ICD-10-CM

## 2021-01-21 DIAGNOSIS — Z1211 Encounter for screening for malignant neoplasm of colon: Secondary | ICD-10-CM

## 2021-01-21 DIAGNOSIS — G43019 Migraine without aura, intractable, without status migrainosus: Secondary | ICD-10-CM

## 2021-01-21 DIAGNOSIS — Z1159 Encounter for screening for other viral diseases: Secondary | ICD-10-CM

## 2021-01-21 DIAGNOSIS — Z23 Encounter for immunization: Secondary | ICD-10-CM | POA: Diagnosis not present

## 2021-01-21 MED ORDER — TOPIRAMATE 50 MG PO TABS
50.0000 mg | ORAL_TABLET | Freq: Two times a day (BID) | ORAL | 3 refills | Status: DC
Start: 2021-01-21 — End: 2022-01-12

## 2021-01-21 MED ORDER — RIZATRIPTAN BENZOATE 10 MG PO TABS
10.0000 mg | ORAL_TABLET | Freq: Three times a day (TID) | ORAL | 11 refills | Status: DC | PRN
Start: 2021-01-21 — End: 2022-02-09

## 2021-01-21 NOTE — Patient Instructions (Signed)
Health Maintenance, Female Adopting a healthy lifestyle and getting preventive care are important in promoting health and wellness. Ask your health care provider about: The right schedule for you to have regular tests and exams. Things you can do on your own to prevent diseases and keep yourself healthy. What should I know about diet, weight, and exercise? Eat a healthy diet  Eat a diet that includes plenty of vegetables, fruits, low-fat dairy products, and lean protein. Do not eat a lot of foods that are high in solid fats, added sugars, or sodium.  Maintain a healthy weight Body mass index (BMI) is used to identify weight problems. It estimates body fat based on height and weight. Your health care provider can help determineyour BMI and help you achieve or maintain a healthy weight. Get regular exercise Get regular exercise. This is one of the most important things you can do for your health. Most adults should: Exercise for at least 150 minutes each week. The exercise should increase your heart rate and make you sweat (moderate-intensity exercise). Do strengthening exercises at least twice a week. This is in addition to the moderate-intensity exercise. Spend less time sitting. Even light physical activity can be beneficial. Watch cholesterol and blood lipids Have your blood tested for lipids and cholesterol at 50 years of age, then havethis test every 5 years. Have your cholesterol levels checked more often if: Your lipid or cholesterol levels are high. You are older than 50 years of age. You are at high risk for heart disease. What should I know about cancer screening? Depending on your health history and family history, you may need to have cancer screening at various ages. This may include screening for: Breast cancer. Cervical cancer. Colorectal cancer. Skin cancer. Lung cancer. What should I know about heart disease, diabetes, and high blood pressure? Blood pressure and heart  disease High blood pressure causes heart disease and increases the risk of stroke. This is more likely to develop in people who have high blood pressure readings, are of African descent, or are overweight. Have your blood pressure checked: Every 3-5 years if you are 18-39 years of age. Every year if you are 40 years old or older. Diabetes Have regular diabetes screenings. This checks your fasting blood sugar level. Have the screening done: Once every three years after age 40 if you are at a normal weight and have a low risk for diabetes. More often and at a younger age if you are overweight or have a high risk for diabetes. What should I know about preventing infection? Hepatitis B If you have a higher risk for hepatitis B, you should be screened for this virus. Talk with your health care provider to find out if you are at risk forhepatitis B infection. Hepatitis C Testing is recommended for: Everyone born from 1945 through 1965. Anyone with known risk factors for hepatitis C. Sexually transmitted infections (STIs) Get screened for STIs, including gonorrhea and chlamydia, if: You are sexually active and are younger than 50 years of age. You are older than 50 years of age and your health care provider tells you that you are at risk for this type of infection. Your sexual activity has changed since you were last screened, and you are at increased risk for chlamydia or gonorrhea. Ask your health care provider if you are at risk. Ask your health care provider about whether you are at high risk for HIV. Your health care provider may recommend a prescription medicine to help   prevent HIV infection. If you choose to take medicine to prevent HIV, you should first get tested for HIV. You should then be tested every 3 months for as long as you are taking the medicine. Pregnancy If you are about to stop having your period (premenopausal) and you may become pregnant, seek counseling before you get  pregnant. Take 400 to 800 micrograms (mcg) of folic acid every day if you become pregnant. Ask for birth control (contraception) if you want to prevent pregnancy. Osteoporosis and menopause Osteoporosis is a disease in which the bones lose minerals and strength with aging. This can result in bone fractures. If you are 65 years old or older, or if you are at risk for osteoporosis and fractures, ask your health care provider if you should: Be screened for bone loss. Take a calcium or vitamin D supplement to lower your risk of fractures. Be given hormone replacement therapy (HRT) to treat symptoms of menopause. Follow these instructions at home: Lifestyle Do not use any products that contain nicotine or tobacco, such as cigarettes, e-cigarettes, and chewing tobacco. If you need help quitting, ask your health care provider. Do not use street drugs. Do not share needles. Ask your health care provider for help if you need support or information about quitting drugs. Alcohol use Do not drink alcohol if: Your health care provider tells you not to drink. You are pregnant, may be pregnant, or are planning to become pregnant. If you drink alcohol: Limit how much you use to 0-1 drink a day. Limit intake if you are breastfeeding. Be aware of how much alcohol is in your drink. In the [place] one drink equals one 12 oz bottle of beer (355 mL), one 5 oz glass of wine (148 mL), or one 1 oz glass of hard liquor (44 mL). General instructions Schedule regular health, dental, and eye exams. Stay current with your vaccines. Tell your health care provider if: You often feel depressed. You have ever been abused or do not feel safe at home. Summary Adopting a healthy lifestyle and getting preventive care are important in promoting health and wellness. Follow your health care provider's instructions about healthy diet, exercising, and getting tested or screened for diseases. Follow your health care provider's  instructions on monitoring your cholesterol and blood pressure. This information is not intended to replace advice given to you by your health care provider. Make sure you discuss any questions you have with your healthcare provider. Document Revised: 06/29/2018 Document Reviewed: 06/29/2018 Elsevier Patient Education  2022 [employer]  

## 2021-01-21 NOTE — Assessment & Plan Note (Signed)
She feels like this is related to all of her vitamins Encouraged high-fiber diet and adequate water intake

## 2021-01-21 NOTE — Progress Notes (Signed)
Subjective:    Patient ID: Emily Dalton, female    DOB: 08-07-1970, 50 y.o.   MRN: [id number]  HPI  Patient presents the clinic today for her annual exam.  Migraines: These occur about 1 times a month. She is taking Topamax and Maxalt as prescribed.  She does not follow with neurology.  Flu: 03/2019 Tetanus: 10/2010 COVID: never Pap smear: partial hysterectomy Mammogram: 07/2018 Colon screening: never Vision screening: annually Dentist: biannually  Diet: She does eat meat. She consumes fruits and veggies. She tries to avoid fried foods. She drinks mostly water and coffee. Exercise: [employer] app 4 days per week  Review of Systems     Past Medical History:  Diagnosis Date   Chicken pox    Dysplastic nevus 02/28/2020   left low back paraspinal, excised 04/30/20   Dysplastic nevus 33/29/5188   right umbilicus, right shoulder, right breast, right epigastric - mild    Dysplastic nevus 12/05/2020   left mid side, moderate atypia    Dysplastic nevus 12/05/2020   left anterior mid to distal thigh, moderate atypia    Migraines     Current Outpatient Medications  Medication Sig Dispense Refill   acetaminophen (TYLENOL) 500 MG tablet Take 500 mg by mouth as needed for headache.     azithromycin (ZITHROMAX) 250 MG tablet Take 2 tabs today, then 1 tab daily x 4 days 6 tablet 0   Calcium-Magnesium-Zinc 500-250-12.5 MG TABS Take by mouth.     chlorpheniramine-HYDROcodone (TUSSIONEX PENNKINETIC ER) 10-8 MG/5ML SUER Take 5 mLs by mouth at bedtime as needed. 140 mL 0   Cholecalciferol (VITAMIN D-3) 25 MCG (1000 UT) CAPS Take by mouth.     clindamycin (CLEOCIN-T) 1 % lotion Apply to aa's chest QHS. 60 mL 0   Docusate Calcium (STOOL SOFTENER PO) Take by mouth.     ibuprofen (ADVIL,MOTRIN) 200 MG tablet Take 800 mg by mouth as needed for headache.      Magnesium 400 MG CAPS Take 1 capsule by mouth daily.     mupirocin ointment (BACTROBAN) 2 % Apply 1 application topically daily. Qd to  excision site 22 g 1   rizatriptan (MAXALT) 10 MG tablet Take 1 tablet (10 mg total) by mouth 3 (three) times daily as needed for migraine. 10 tablet 11   topiramate (TOPAMAX) 50 MG tablet TAKE 1 TABLET BY MOUTH TWICE A DAY 60 tablet 0   vitamin B-12 (CYANOCOBALAMIN) 1000 MCG tablet Take 1,000 mcg by mouth daily.     No current facility-administered medications for this visit.    Allergies  Allergen Reactions   Morphine And Related Other (See Comments)    Many years ago--does not remember     Family History  Problem Relation Age of Onset   Hypertension Mother    Diabetes Mother    Arthritis Father    Hypertension Maternal Uncle    Hypertension Maternal Grandmother    Hypertension Maternal Grandfather    Cancer Paternal Grandfather     Social History   Socioeconomic History   Marital status: Married    Spouse name: Not on file   Number of children: Not on file   Years of education: Not on file   Highest education level: Not on file  Occupational History   Not on file  Tobacco Use   Smoking status: Former    Pack years: 0.00    Types: E-cigarettes   Smokeless tobacco: Never   Tobacco comments:    01/2019  Substance and  Sexual Activity   Alcohol use: No    Alcohol/week: 0.0 standard drinks   Drug use: No   Sexual activity: Yes  Other Topics Concern   Not on file  Social History Narrative   Not on file   Social Determinants of Health   Financial Resource Strain: Not on file  Food Insecurity: Not on file  Transportation Needs: Not on file  Physical Activity: Not on file  Stress: Not on file  Social Connections: Not on file  Intimate Partner Violence: Not on file     Constitutional: Pt reports intermittent headaches. Denies fever, malaise, fatigue, or abrupt weight changes.  HEENT: Denies eye pain, eye redness, ear pain, ringing in the ears, wax buildup, runny nose, nasal congestion, bloody nose, or sore throat. Respiratory: Denies difficulty breathing,  shortness of breath, cough or sputum production.   Cardiovascular: Denies chest pain, chest tightness, palpitations or swelling in the hands or feet.  Gastrointestinal: Patient reports chronic constipation.  Denies abdominal pain, bloating, diarrhea or blood in the stool.  GU: Denies urgency, frequency, pain with urination, burning sensation, blood in urine, odor or discharge. Musculoskeletal: Denies decrease in range of motion, difficulty with gait, muscle pain or joint pain and swelling.  Skin: Denies redness, rashes, lesions or ulcercations.  Neurological: Denies dizziness, difficulty with memory, difficulty with speech or problems with balance and coordination.  Psych: Denies anxiety, depression, SI/HI.  No other specific complaints in a complete review of systems (except as listed in HPI above).  Objective:   Physical Exam  BP (!) 108/55   Pulse 63   Temp (!) 97.5 F (36.4 C) (Tympanic)   Resp 16   Ht '5\' 5"'  (1.651 m)   Wt 147 lb (66.7 kg)   SpO2 100%   BMI 24.46 kg/m   Wt Readings from Last 3 Encounters:  04/25/20 128 lb (58.1 kg)  01/15/20 139 lb (63 kg)  05/18/18 151 lb (68.5 kg)    General: Appears her stated age, well developed, well nourished in NAD. Skin: Warm, dry and intact. No rashes noted. HEENT: Head: normal shape and size; Eyes: sclera white and EOMs intact;  Neck:  Neck supple, trachea midline. No masses, lumps or thyromegaly present.  Cardiovascular: Normal rate and rhythm. S1,S2 noted.  No murmur, rubs or gallops noted. No JVD or BLE edema. No carotid bruits noted. Pulmonary/Chest: Normal effort and positive vesicular breath sounds. No respiratory distress. No wheezes, rales or ronchi noted.  Abdomen: Soft and nontender. Normal bowel sounds. No distention or masses noted. Liver, spleen and kidneys non palpable. Musculoskeletal: Strength 5/5 BUE/BLE no difficulty with gait.  Neurological: Alert and oriented. Cranial nerves II-XII grossly intact. Coordination  normal.  Psychiatric: Mood and affect normal. Behavior is normal. Judgment and thought content normal.     BMET    Component Value Date/Time   NA 138 01/15/2020 1545   K 4.1 01/15/2020 1545   CL 106 01/15/2020 1545   CO2 23 01/15/2020 1545   GLUCOSE 74 01/15/2020 1545   BUN 20 01/15/2020 1545   CREATININE 0.88 01/15/2020 1545   CALCIUM 9.0 01/15/2020 1545    Lipid Panel     Component Value Date/Time   CHOL 183 01/15/2020 1545   TRIG 73.0 01/15/2020 1545   HDL 52.10 01/15/2020 1545   CHOLHDL 4 01/15/2020 1545   VLDL 14.6 01/15/2020 1545   LDLCALC 116 (H) 01/15/2020 1545    CBC    Component Value Date/Time   WBC 7.0 01/15/2020 1545  RBC 4.14 01/15/2020 1545   HGB 12.9 01/15/2020 1545   HCT 38.5 01/15/2020 1545   PLT 280.0 01/15/2020 1545   MCV 92.9 01/15/2020 1545   MCHC 33.6 01/15/2020 1545   RDW 14.0 01/15/2020 1545    Hgb A1C No results found for: HGBA1C          Assessment & Plan:   Preventative Health Maintenance:  Encouraged her to get a flu shot in the fall Tetanus today Encouraged her to get a COVID-vaccine She no longer needs Pap smears Mammogram due, order placed.  She will call to schedule Referral to GI for screening colonoscopy Encouraged her to consume a balanced diet and exercise regimen Advised her to see an eye doctor and dentist annually We will check CBC, c-Met, lipid and hep C today  RTC in 1 year, sooner if needed Webb Silversmith, NP This visit occurred during the SARS-CoV-2 public health emergency.  Safety protocols were in place, including screening questions prior to the visit, additional usage of staff PPE, and extensive cleaning of exam room while observing appropriate contact time as indicated for disinfecting solutions.

## 2021-01-21 NOTE — Assessment & Plan Note (Signed)
Continue Topamax and Maxalt, refilled today Will monitor

## 2021-01-22 LAB — COMPLETE METABOLIC PANEL WITH GFR
AG Ratio: 1.9 (calc) (ref 1.0–2.5)
ALT: 12 U/L (ref 6–29)
AST: 10 U/L (ref 10–35)
Albumin: 3.8 g/dL (ref 3.6–5.1)
Alkaline phosphatase (APISO): 52 U/L (ref 37–153)
BUN: 14 mg/dL (ref 7–25)
CO2: 23 mmol/L (ref 20–32)
Calcium: 9 mg/dL (ref 8.6–10.4)
Chloride: 110 mmol/L (ref 98–110)
Creat: 0.76 mg/dL (ref 0.50–1.05)
GFR, Est African American: 106 mL/min/{1.73_m2} (ref >=60)
GFR, Est Non African American: 91 mL/min/{1.73_m2} (ref >=60)
Globulin: 2 g/dL (calc) (ref 1.9–3.7)
Glucose, Bld: 79 mg/dL (ref 65–99)
Potassium: 4.3 mmol/L (ref 3.5–5.3)
Sodium: 141 mmol/L (ref 135–146)
Total Bilirubin: 0.3 mg/dL (ref 0.2–1.2)
Total Protein: 5.8 g/dL — ABNORMAL LOW (ref 6.1–8.1)

## 2021-01-22 LAB — CBC
HCT: 42.1 % (ref 35.0–45.0)
Hemoglobin: 13.7 g/dL (ref 11.7–15.5)
MCH: 31.1 pg (ref 27.0–33.0)
MCHC: 32.5 g/dL (ref 32.0–36.0)
MCV: 95.7 fL (ref 80.0–100.0)
MPV: 9.7 fL (ref 7.5–12.5)
Platelets: 301 10*3/uL (ref 140–400)
RBC: 4.4 10*6/uL (ref 3.80–5.10)
RDW: 12.4 % (ref 11.0–15.0)
WBC: 5 10*3/uL (ref 3.8–10.8)

## 2021-01-22 LAB — HEPATITIS C ANTIBODY
Hepatitis C Ab: NONREACTIVE
SIGNAL TO CUT-OFF: 0 (ref <1.00)

## 2021-01-22 LAB — LIPID PANEL
Cholesterol: 181 mg/dL (ref <200)
HDL: 64 mg/dL (ref >=50)
LDL Cholesterol (Calc): 104 mg/dL (calc) — ABNORMAL HIGH
Non-HDL Cholesterol (Calc): 117 mg/dL (calc) (ref <130)
Total CHOL/HDL Ratio: 2.8 (calc) (ref <5.0)
Triglycerides: 51 mg/dL (ref <150)

## 2021-02-11 ENCOUNTER — Other Ambulatory Visit: Payer: Self-pay

## 2021-02-11 MED ORDER — CLENPIQ 10-3.5-12 MG-GM -GM/160ML PO SOLN: 320.0000 mL | ORAL | 0 refills | Status: DC

## 2021-02-18 ENCOUNTER — Other Ambulatory Visit: Payer: Self-pay | Admitting: Gastroenterology

## 2021-02-22 ENCOUNTER — Other Ambulatory Visit: Payer: Self-pay | Admitting: Gastroenterology

## 2021-02-26 ENCOUNTER — Other Ambulatory Visit: Payer: Self-pay

## 2021-02-26 ENCOUNTER — Ambulatory Visit
Admission: RE | Admit: 2021-02-26 | Discharge: 2021-02-26 | Disposition: A | Discharge status: Home / Self Care | Payer: BC Managed Care – PPO | Source: Ambulatory Visit | Attending: Internal Medicine | Admitting: Internal Medicine

## 2021-02-26 DIAGNOSIS — Z1231 Encounter for screening mammogram for malignant neoplasm of breast: Secondary | ICD-10-CM | POA: Diagnosis not present

## 2021-02-27 ENCOUNTER — Other Ambulatory Visit: Payer: Self-pay | Admitting: Internal Medicine

## 2021-02-27 DIAGNOSIS — N6320 Unspecified lump in the left breast, unspecified quadrant: Secondary | ICD-10-CM

## 2021-02-27 DIAGNOSIS — R928 Other abnormal and inconclusive findings on diagnostic imaging of breast: Secondary | ICD-10-CM

## 2021-03-03 ENCOUNTER — Encounter: Payer: Self-pay | Admitting: Internal Medicine

## 2021-03-05 ENCOUNTER — Encounter: Payer: BC Managed Care – PPO | Admitting: Dermatology

## 2021-03-05 ENCOUNTER — Other Ambulatory Visit: Payer: Self-pay

## 2021-03-05 ENCOUNTER — Ambulatory Visit
Admission: RE | Admit: 2021-03-05 | Discharge: 2021-03-05 | Disposition: A | Discharge status: Home / Self Care | Payer: BC Managed Care – PPO | Source: Ambulatory Visit | Attending: Internal Medicine | Admitting: Internal Medicine

## 2021-03-05 DIAGNOSIS — N6320 Unspecified lump in the left breast, unspecified quadrant: Secondary | ICD-10-CM

## 2021-03-05 DIAGNOSIS — R928 Other abnormal and inconclusive findings on diagnostic imaging of breast: Secondary | ICD-10-CM

## 2021-03-05 DIAGNOSIS — R922 Inconclusive mammogram: Secondary | ICD-10-CM | POA: Diagnosis not present

## 2021-03-18 ENCOUNTER — Ambulatory Visit: Payer: BC Managed Care – PPO | Admitting: Anesthesiology

## 2021-03-18 ENCOUNTER — Ambulatory Visit
Admission: RE | Admit: 2021-03-18 | Discharge: 2021-03-18 | Disposition: A | Discharge status: Home / Self Care | Payer: BC Managed Care – PPO | Attending: Gastroenterology | Admitting: Gastroenterology

## 2021-03-18 ENCOUNTER — Encounter
Admission: RE | Disposition: A | Discharge status: Home / Self Care | Payer: Self-pay | Source: Home / Self Care | Attending: Gastroenterology

## 2021-03-18 DIAGNOSIS — Z1211 Encounter for screening for malignant neoplasm of colon: Secondary | ICD-10-CM | POA: Diagnosis not present

## 2021-03-18 DIAGNOSIS — Z87891 Personal history of nicotine dependence: Secondary | ICD-10-CM | POA: Insufficient documentation

## 2021-03-18 DIAGNOSIS — D123 Benign neoplasm of transverse colon: Secondary | ICD-10-CM | POA: Insufficient documentation

## 2021-03-18 DIAGNOSIS — D126 Benign neoplasm of colon, unspecified: Secondary | ICD-10-CM | POA: Diagnosis not present

## 2021-03-18 DIAGNOSIS — Z885 Allergy status to narcotic agent status: Secondary | ICD-10-CM | POA: Insufficient documentation

## 2021-03-18 DIAGNOSIS — K644 Residual hemorrhoidal skin tags: Secondary | ICD-10-CM | POA: Insufficient documentation

## 2021-03-18 DIAGNOSIS — K635 Polyp of colon: Secondary | ICD-10-CM

## 2021-03-18 DIAGNOSIS — Z79899 Other long term (current) drug therapy: Secondary | ICD-10-CM | POA: Diagnosis not present

## 2021-03-18 DIAGNOSIS — K649 Unspecified hemorrhoids: Secondary | ICD-10-CM | POA: Diagnosis not present

## 2021-03-18 HISTORY — PX: COLONOSCOPY: SHX5424

## 2021-03-18 SURGERY — COLONOSCOPY
Anesthesia: General

## 2021-03-18 MED ORDER — PROPOFOL 500 MG/50ML IV EMUL
INTRAVENOUS | Status: DC | PRN
  Administered 2021-03-18: 200 ug/kg/min via INTRAVENOUS

## 2021-03-18 MED ORDER — PROPOFOL 10 MG/ML IV BOLUS
INTRAVENOUS | Status: DC | PRN
  Administered 2021-03-18: 70 mg via INTRAVENOUS

## 2021-03-18 MED ORDER — SODIUM CHLORIDE 0.9 % IV SOLN
INTRAVENOUS | Status: DC
  Administered 2021-03-18: 20 mL/h via INTRAVENOUS

## 2021-03-18 MED ORDER — PROPOFOL 500 MG/50ML IV EMUL
INTRAVENOUS | Status: AC
  Filled 2021-03-18: qty 50

## 2021-03-18 MED ORDER — LIDOCAINE 2% (20 MG/ML) 5 ML SYRINGE
INTRAMUSCULAR | Status: DC | PRN
  Administered 2021-03-18: 50 mg via INTRAVENOUS

## 2021-03-18 NOTE — Transfer of Care (Signed)
Immediate Anesthesia Transfer of Care Note  Patient: Emily Dalton  Procedure(s) Performed: COLONOSCOPY  Patient Location: Endoscopy Unit  Anesthesia Type:General  Level of Consciousness: drowsy  Airway & Oxygen Therapy: Patient Spontanous Breathing  Post-op Assessment: Post -op Vital signs reviewed and stable  Post vital signs: stable  Last Vitals:  Vitals Value Taken Time  BP 91/57 03/18/21 0847  Temp    Pulse 68 03/18/21 0847  Resp 30 03/18/21 0847  SpO2 99 % 03/18/21 0847  Vitals shown include unvalidated device data.  Last Pain:  Vitals:   03/18/21 0735  TempSrc: Temporal  PainSc: 0-No pain         Complications: No notable events documented.

## 2021-03-18 NOTE — H&P (Signed)
Cephas Darby, MD [address]  [place]  [place], [place] [address]  Main: [phone]  Fax: [phone] Pager: [phone]  Primary Care Physician:  Jearld Fenton, NP Primary Gastroenterologist:  Dr. Cephas Darby  Pre-Procedure History & Physical: HPI:  Gabby Shelman is a 50 y.o. female is here for an colonoscopy.   Past Medical History:  Diagnosis Date   Chicken pox    Dysplastic nevus 02/28/2020   left low back paraspinal, excised 04/30/20   Dysplastic nevus [id number]   right umbilicus, right shoulder, right breast, right epigastric - mild    Dysplastic nevus 12/05/2020   left mid side, moderate atypia    Dysplastic nevus 12/05/2020   left anterior mid to distal thigh, moderate atypia    Migraines     Past Surgical History:  Procedure Laterality Date   ABDOMINAL HYSTERECTOMY  2005   AUGMENTATION MAMMAPLASTY Bilateral 2001; 2014    implants replaced 2014 saline anterior to implants   MASTOPEXY     with implant removal   SPINAL FUSION  1986    Prior to Admission medications   Medication Sig Start Date End Date Taking? Authorizing Provider  acetaminophen (TYLENOL) 500 MG tablet Take 500 mg by mouth as needed for headache.   Yes [provider]  Calcium-Magnesium-Zinc 500-250-12.5 MG TABS Take by mouth.   Yes [provider]  Cholecalciferol (VITAMIN D-3) 25 MCG (1000 UT) CAPS Take by mouth.   Yes [provider]  CLENPIQ 10-3.5-12 MG-GM -GM/160ML SOLN TAKE 320 MLS BY MOUTH AS DIRECTED. 02/24/21  Yes Jonathon Bellows, MD  clindamycin (CLEOCIN-T) 1 % lotion Apply to aa's chest QHS. 12/04/20  Yes Ralene Bathe, MD  Docusate Calcium (STOOL SOFTENER PO) Take by mouth.   Yes [provider]  Ginkgo Biloba (GINKOBA PO) Take 120 mg by mouth daily.   Yes [provider]  ibuprofen (ADVIL,MOTRIN) 200 MG tablet Take 800 mg by mouth as needed for headache.    Yes [provider]  Magnesium 400 MG CAPS Take 1 capsule  by mouth daily.   Yes [provider]  mupirocin ointment (BACTROBAN) 2 % Apply 1 application topically daily. Qd to excision site 04/30/20  Yes Ralene Bathe, MD  rizatriptan (MAXALT) 10 MG tablet Take 1 tablet (10 mg total) by mouth 3 (three) times daily as needed for migraine. 01/21/21  Yes Jearld Fenton, NP  topiramate (TOPAMAX) 50 MG tablet Take 1 tablet (50 mg total) by mouth 2 (two) times daily. 01/21/21  Yes Baity, Coralie Keens, NP  vitamin B-12 (CYANOCOBALAMIN) 1000 MCG tablet Take 1,000 mcg by mouth daily.   Yes [provider]  zinc gluconate 50 MG tablet Take 50 mg by mouth daily.   Yes [provider]    Allergies as of 02/11/2021 - Review Complete 01/21/2021  Allergen Reaction Noted   Morphine and related Other (See Comments) 01/24/2015    Family History  Problem Relation Age of Onset   Hypertension Mother    Diabetes Mother    Arthritis Father    Hypertension Maternal Uncle    Hypertension Maternal Grandmother    Hypertension Maternal Grandfather    Cancer Paternal Grandfather    Breast cancer Neg Hx     Social History   Socioeconomic History   Marital status: Married    Spouse name: Not on file   Number of children: Not on file   Years of education: Not on file   Highest education  level: Not on file  Occupational History   Not on file  Tobacco Use   Smoking status: Former    Types: E-cigarettes   Smokeless tobacco: Never   Tobacco comments:    01/2019  Substance and Sexual Activity   Alcohol use: No    Alcohol/week: 0.0 standard drinks   Drug use: No   Sexual activity: Yes  Other Topics Concern   Not on file  Social History Narrative   Not on file   Social Determinants of Health   Financial Resource Strain: Not on file  Food Insecurity: Not on file  Transportation Needs: Not on file  Physical Activity: Not on file  Stress: Not on file  Social Connections: Not on file  Intimate Partner Violence: Not on file    Review  of Systems: See HPI, otherwise negative ROS  Physical Exam: BP 110/76   Pulse (!) 57   Temp (!) 96 F (35.6 C) (Temporal)   Resp 20   Ht '5\' 5"'$  (1.651 m)   Wt 63.5 kg   SpO2 100%   BMI 23.30 kg/m  General:   Alert,  pleasant and cooperative in NAD Head:  Normocephalic and atraumatic. Neck:  Supple; no masses or thyromegaly. Lungs:  Clear throughout to auscultation.    Heart:  Regular rate and rhythm. Abdomen:  Soft, nontender and nondistended. Normal bowel sounds, without guarding, and without rebound.   Neurologic:  Alert and  oriented x4;  grossly normal neurologically.  Impression/Plan: Martiza Rask is here for an colonoscopy to be performed for colon cancer screening  Risks, benefits, limitations, and alternatives regarding  colonoscopy have been reviewed with the patient.  Questions have been answered.  All parties agreeable.   Sherri Sear, MD  03/18/2021, 7:38 AM

## 2021-03-18 NOTE — Anesthesia Preprocedure Evaluation (Signed)
Anesthesia Evaluation  Patient identified by MRN, date of birth, ID band Patient awake    Reviewed: Allergy & Precautions, H&P , NPO status , Patient's Chart, lab work & pertinent test results, reviewed documented beta blocker date and time   Airway Mallampati: II   Neck ROM: full    Dental  (+) Poor Dentition   Pulmonary neg pulmonary ROS, former smoker,    Pulmonary exam normal        Cardiovascular Exercise Tolerance: Good negative cardio ROS Normal cardiovascular exam Rhythm:regular Rate:Normal     Neuro/Psych  Headaches, negative psych ROS   GI/Hepatic negative GI ROS, Neg liver ROS,   Endo/Other  negative endocrine ROS  Renal/GU negative Renal ROS  negative genitourinary   Musculoskeletal   Abdominal   Peds  Hematology negative hematology ROS (+)   Anesthesia Other Findings Past Medical History: No date: Chicken pox 02/28/2020: Dysplastic nevus     Comment:  left low back paraspinal, excised 04/30/20 05/07/2020: Dysplastic nevus     Comment:  right umbilicus, right shoulder, right breast, right               epigastric - mild  12/05/2020: Dysplastic nevus     Comment:  left mid side, moderate atypia  12/05/2020: Dysplastic nevus     Comment:  left anterior mid to distal thigh, moderate atypia  No date: Migraines Past Surgical History: 2005: ABDOMINAL HYSTERECTOMY 2001; 2014 : AUGMENTATION MAMMAPLASTY; Bilateral     Comment:  implants replaced 2014 saline anterior to implants No date: MASTOPEXY     Comment:  with implant removal 1986: SPINAL FUSION BMI    Body Mass Index: 23.30 kg/m     Reproductive/Obstetrics negative OB ROS                             Anesthesia Physical Anesthesia Plan  ASA: 2  Anesthesia Plan: General   Post-op Pain Management:    Induction:   PONV Risk Score and Plan:   Airway Management Planned:   Additional Equipment:   Intra-op  Plan:   Post-operative Plan:   Informed Consent: I have reviewed the patients History and Physical, chart, labs and discussed the procedure including the risks, benefits and alternatives for the proposed anesthesia with the patient or authorized representative who has indicated his/her understanding and acceptance.     Dental Advisory Given  Plan Discussed with: CRNA  Anesthesia Plan Comments:         Anesthesia Quick Evaluation

## 2021-03-18 NOTE — Anesthesia Postprocedure Evaluation (Signed)
Anesthesia Post Note  Patient: Emily Dalton  Procedure(s) Performed: COLONOSCOPY  Patient location during evaluation: PACU Anesthesia Type: General Level of consciousness: awake and alert Pain management: pain level controlled Vital Signs Assessment: post-procedure vital signs reviewed and stable Respiratory status: spontaneous breathing, nonlabored ventilation, respiratory function stable and patient connected to nasal cannula oxygen Cardiovascular status: blood pressure returned to baseline and stable Postop Assessment: no apparent nausea or vomiting Anesthetic complications: no   No notable events documented.   Last Vitals:  Vitals:   03/18/21 0857 03/18/21 0907  BP: 100/66 108/70  Pulse: (!) 58 (!) 50  Resp: 17 17  Temp:    SpO2: 100% 100%    Last Pain:  Vitals:   03/18/21 0907  TempSrc:   PainSc: 0-No pain                 Molli Barrows

## 2021-03-18 NOTE — Op Note (Signed)
[facility]astroenter[facility] Patient Name: Emily Dalton Procedure Date: 03/18/2021 8:13 AM MRN: [id number] Account #: [id number] Date of Birth: June 15, 1971 Admit Type: Outpatient Age: 50 Room: [facility] ENDO ROOM 2 Gender: Female Note Status: Finalized Procedure:             Colonoscopy Indications:           Screening for colorectal malignant neoplasm, This is                         the patient's first colonoscopy Providers:             Lin Landsman MD, MD Medicines:             General Anesthesia Complications:         No immediate complications. Estimated blood loss: None. Procedure:             Pre-Anesthesia Assessment:                        - Prior to the procedure, a History and Physical was                         performed, and patient medications and allergies were                         reviewed. The patient is competent. The risks and                         benefits of the procedure and the sedation options and                         risks were discussed with the patient. All questions                         were answered and informed consent was obtained.                         Patient identification and proposed procedure were                         verified by the physician, the nurse, the                         anesthesiologist, the anesthetist and the technician                         in the pre-procedure area in the procedure room in the                         endoscopy suite. Mental Status Examination: alert and                         oriented. Airway Examination: normal oropharyngeal                         airway and neck mobility. Respiratory Examination:                         clear to auscultation. CV Examination: normal.  Prophylactic Antibiotics: The patient does not require                         prophylactic antibiotics. Prior Anticoagulants: The                         patient has taken no previous  anticoagulant or                         antiplatelet agents. ASA Grade Assessment: II - A                         patient with mild systemic disease. After reviewing                         the risks and benefits, the patient was deemed in                         satisfactory condition to undergo the procedure. The                         anesthesia plan was to use general anesthesia.                         Immediately prior to administration of medications,                         the patient was re-assessed for adequacy to receive                         sedatives. The heart rate, respiratory rate, oxygen                         saturations, blood pressure, adequacy of pulmonary                         ventilation, and response to care were monitored                         throughout the procedure. The physical status of the                         patient was re-assessed after the procedure.                        After obtaining informed consent, the colonoscope was                         passed under direct vision. Throughout the procedure,                         the patient's blood pressure, pulse, and oxygen                         saturations were monitored continuously. The                         Colonoscope was introduced through the anus and  advanced to the the cecum, identified by appendiceal                         orifice and ileocecal valve. The colonoscopy was                         performed without difficulty. The patient tolerated                         the procedure well. The quality of the bowel                         preparation was evaluated using the BBPS [facility] Bowel                         Preparation Scale) with scores of: Right Colon = 3,                         Transverse Colon = 3 and Left Colon = 3 (entire mucosa                         seen well with no residual staining, small fragments                         of stool or  opaque liquid). The total BBPS score                         equals 9. Findings:      The perianal and digital rectal examinations were normal. Pertinent       negatives include normal sphincter tone and no palpable rectal lesions.      Two sessile polyps were found in the descending colon and transverse       colon. The polyps were 3 to 4 mm in size. These polyps were removed with       a cold snare. Resection and retrieval were complete.      Non-bleeding external hemorrhoids were found during retroflexion. The       hemorrhoids were medium-sized.      The exam was otherwise without abnormality. Impression:            - Two 3 to 4 mm polyps in the descending colon and in                         the transverse colon, removed with a cold snare.                         Resected and retrieved.                        - Non-bleeding external hemorrhoids.                        - The examination was otherwise normal. Recommendation:        - Discharge patient to home (with escort).                        - Resume previous diet today.                        -  Continue present medications.                        - Await pathology results.                        - Repeat colonoscopy in 7 years for surveillance. Procedure Code(s):     --- Professional ---                        [phone], Colonoscopy, flexible; with removal of                         tumor(s), polyp(s), or other lesion(s) by snare                         technique Diagnosis Code(s):     --- Professional ---                        Z12.11, Encounter for screening for malignant neoplasm                         of colon                        K63.5, Polyp of colon                        K64.4, Residual hemorrhoidal skin tags CPT copyright 2019 American [employer] Association. All rights reserved. The codes documented in this report are preliminary and upon coder review may  be revised to meet current compliance requirements. Dr. Ulyess Mort Lin Landsman MD, MD 03/18/2021 8:45:02 AM This report has been signed electronically. Number of Addenda: 0 Note Initiated On: 03/18/2021 8:13 AM Scope Withdrawal Time: 0 hours 11 minutes 20 seconds  Total Procedure Duration: 0 hours 15 minutes 20 seconds  Estimated Blood Loss:  Estimated blood loss: none.      [facility]

## 2021-03-19 ENCOUNTER — Encounter: Payer: Self-pay | Admitting: Gastroenterology

## 2021-03-19 LAB — SURGICAL PATHOLOGY

## 2021-05-20 ENCOUNTER — Ambulatory Visit: Payer: BC Managed Care – PPO | Admitting: Dermatology

## 2021-05-20 ENCOUNTER — Other Ambulatory Visit: Payer: Self-pay

## 2021-05-20 ENCOUNTER — Encounter: Payer: Self-pay | Admitting: Dermatology

## 2021-05-20 DIAGNOSIS — Z86018 Personal history of other benign neoplasm: Secondary | ICD-10-CM | POA: Diagnosis not present

## 2021-05-20 DIAGNOSIS — Z1283 Encounter for screening for malignant neoplasm of skin: Secondary | ICD-10-CM | POA: Diagnosis not present

## 2021-05-20 DIAGNOSIS — L821 Other seborrheic keratosis: Secondary | ICD-10-CM

## 2021-05-20 DIAGNOSIS — D229 Melanocytic nevi, unspecified: Secondary | ICD-10-CM

## 2021-05-20 DIAGNOSIS — D225 Melanocytic nevi of trunk: Secondary | ICD-10-CM

## 2021-05-20 DIAGNOSIS — L719 Rosacea, unspecified: Secondary | ICD-10-CM

## 2021-05-20 DIAGNOSIS — L578 Other skin changes due to chronic exposure to nonionizing radiation: Secondary | ICD-10-CM

## 2021-05-20 DIAGNOSIS — L814 Other melanin hyperpigmentation: Secondary | ICD-10-CM

## 2021-05-20 DIAGNOSIS — D1800 Hemangioma unspecified site: Secondary | ICD-10-CM

## 2021-05-20 NOTE — Progress Notes (Signed)
Follow-Up Visit   Subjective  Emily Dalton is a 50 y.o. female who presents for the following: Annual Exam (History of dysplastic nevi - TBSE today). The patient presents for Total-Body Skin Exam (TBSE) for skin cancer screening and mole check.  The following portions of the chart were reviewed this encounter and updated as appropriate:   Tobacco  Allergies  Meds  Problems  Med Hx  Surg Hx  Fam Hx     Review of Systems:  No other skin or systemic complaints except as noted in HPI or Assessment and Plan.  Objective  Well appearing patient in no apparent distress; mood and affect are within normal limits.  A full examination was performed including scalp, head, eyes, ears, nose, lips, neck, chest, axillae, abdomen, back, buttocks, bilateral upper extremities, bilateral lower extremities, hands, feet, fingers, toes, fingernails, and toenails. All findings within normal limits unless otherwise noted below.  Head - Anterior (Face) Dilated blood vessels  Left Breast 0.4 x 0.2 cm slightly irregular brown macule of left breast inf to areola. 0.5 cm brown macule of umbilicus.        Assessment & Plan   History of Dysplastic Nevi - No evidence of recurrence today - Recommend regular full body skin exams - Recommend daily broad spectrum sunscreen SPF 30+ to sun-exposed areas, reapply every 2 hours as needed.  - Call if any new or changing lesions are noted between office visits  Lentigines - Scattered tan macules - Due to sun exposure - Benign-appearing, observe - Recommend daily broad spectrum sunscreen SPF 30+ to sun-exposed areas, reapply every 2 hours as needed. - Call for any changes  Seborrheic Keratoses - Stuck-on, waxy, tan-brown papules and/or plaques  - Benign-appearing - Discussed benign etiology and prognosis. - Observe - Call for any changes  Melanocytic Nevi - Tan-brown and/or pink-flesh-colored symmetric macules and papules - Benign appearing on  exam today - Observation - Call clinic for new or changing moles - Recommend daily use of broad spectrum spf 30+ sunscreen to sun-exposed areas.   Hemangiomas - Red papules - Discussed benign nature - Observe - Call for any changes  Actinic Damage - Chronic condition, secondary to cumulative UV/sun exposure - diffuse scaly erythematous macules with underlying dyspigmentation - Recommend daily broad spectrum sunscreen SPF 30+ to sun-exposed areas, reapply every 2 hours as needed.  - Staying in the shade or wearing long sleeves, sun glasses (UVA+UVB protection) and wide brim hats (4-inch brim around the entire circumference of the hat) are also recommended for sun protection.  - Call for new or changing lesions.  Skin cancer screening performed today.  Rosacea Head - Anterior (Face)  Rosacea is a chronic progressive skin condition usually affecting the face of adults, causing redness and/or acne bumps. It is treatable but not curable. It sometimes affects the eyes (ocular rosacea) as well. It may respond to topical and/or systemic medication and can flare with stress, sun exposure, alcohol, exercise and some foods.  Daily application of broad spectrum spf 30+ sunscreen to face is recommended to reduce flares.  Condition is mild - no treatment recommended at this time.  Nevus -see photo Left Breast Slightly irregular Benign appearing, observe. May consider removal if any change  Skin cancer screening  Return for 6-12 months , TBSE.  I, Ashok Cordia, CMA, am acting as scribe for Sarina Ser, MD . Documentation: I have reviewed the above documentation for accuracy and completeness, and I agree with the above.  Sarina Ser,  MD

## 2021-05-20 NOTE — Patient Instructions (Signed)

## 2021-10-06 ENCOUNTER — Encounter: Payer: Self-pay | Admitting: Internal Medicine

## 2021-10-06 ENCOUNTER — Other Ambulatory Visit: Payer: Self-pay

## 2021-10-06 ENCOUNTER — Other Ambulatory Visit: Payer: Self-pay | Admitting: Internal Medicine

## 2021-10-06 ENCOUNTER — Ambulatory Visit: Payer: BC Managed Care – PPO | Admitting: Internal Medicine

## 2021-10-06 VITALS — BP 106/68 | HR 77 | Temp 96.6°F | Wt 143.0 lb

## 2021-10-06 DIAGNOSIS — M79662 Pain in left lower leg: Secondary | ICD-10-CM

## 2021-10-06 NOTE — Progress Notes (Signed)
? ?Subjective:  ? ? Patient ID: Emily Dalton, female    DOB: 08/18/1970, 51 y.o.   MRN: [id number] ? ?HPI ? ?Patient presents the clinic today with complaint of left knee pain.  This started 1 month ago.  She describes the pain as tightness, but can be achy, sharp with certain movements, steps etc. She denies numbness, tingling, weakness, redness, but she has had associated swelling. She has tried ice and Tylenol OTC with minimal relief of symptoms. ? ?Review of Systems ? ?   ?Past Medical History:  ?Diagnosis Date  ? Chicken pox   ? Dysplastic nevus 02/28/2020  ? left low back paraspinal, excised 04/30/20  ? Dysplastic nevus 05/07/2020  ? right umbilicus, right shoulder, right breast, right epigastric - mild   ? Dysplastic nevus 12/05/2020  ? left mid side, moderate atypia   ? Dysplastic nevus 12/05/2020  ? left anterior mid to distal thigh, severe atypia, close to margin  ? Migraines   ? ? ?Current Outpatient Medications  ?Medication Sig Dispense Refill  ? acetaminophen (TYLENOL) 500 MG tablet Take 500 mg by mouth as needed for headache.    ? Calcium-Magnesium-Zinc 500-250-12.5 MG TABS Take by mouth.    ? Cholecalciferol (VITAMIN D-3) 25 MCG (1000 UT) CAPS Take by mouth.    ? clindamycin (CLEOCIN-T) 1 % lotion Apply to aa's chest QHS. 60 mL 0  ? Docusate Calcium (STOOL SOFTENER PO) Take by mouth.    ? Ginkgo Biloba (GINKOBA PO) Take 120 mg by mouth daily.    ? ibuprofen (ADVIL,MOTRIN) 200 MG tablet Take 800 mg by mouth as needed for headache.     ? Magnesium 400 MG CAPS Take 1 capsule by mouth daily.    ? mupirocin ointment (BACTROBAN) 2 % Apply 1 application topically daily. Qd to excision site 22 g 1  ? rizatriptan (MAXALT) 10 MG tablet Take 1 tablet (10 mg total) by mouth 3 (three) times daily as needed for migraine. 10 tablet 11  ? topiramate (TOPAMAX) 50 MG tablet Take 1 tablet (50 mg total) by mouth 2 (two) times daily. 180 tablet 3  ? vitamin B-12 (CYANOCOBALAMIN) 1000 MCG tablet Take 1,000 mcg by mouth  daily.    ? zinc gluconate 50 MG tablet Take 50 mg by mouth daily.    ? ?No current facility-administered medications for this visit.  ? ? ?Allergies  ?Allergen Reactions  ? Morphine And Related Other (See Comments)  ?  Many years ago--does not remember ?  ? ? ?Family History  ?Problem Relation Age of Onset  ? Hypertension Mother   ? Diabetes Mother   ? Arthritis Father   ? Hypertension Maternal Uncle   ? Hypertension Maternal Grandmother   ? Hypertension Maternal Grandfather   ? Cancer Paternal Grandfather   ? Breast cancer Neg Hx   ? ? ?Social History  ? ?Socioeconomic History  ? Marital status: Married  ?  Spouse name: Not on file  ? Number of children: Not on file  ? Years of education: Not on file  ? Highest education level: Not on file  ?Occupational History  ? Not on file  ?Tobacco Use  ? Smoking status: Former  ?  Types: E-cigarettes  ? Smokeless tobacco: Never  ? Tobacco comments:  ?  01/2019  ?Substance and Sexual Activity  ? Alcohol use: No  ?  Alcohol/week: 0.0 standard drinks  ? Drug use: No  ? Sexual activity: Yes  ?Other Topics Concern  ? Not on  file  ?Social History Narrative  ? Not on file  ? ?Social Determinants of Health  ? ?Financial Resource Strain: Not on file  ?Food Insecurity: Not on file  ?Transportation Needs: Not on file  ?Physical Activity: Not on file  ?Stress: Not on file  ?Social Connections: Not on file  ?Intimate Partner Violence: Not on file  ? ? ? ?Constitutional: Denies fever, malaise, fatigue, headache or abrupt weight changes.  ?Respiratory: Denies difficulty breathing, shortness of breath, cough or sputum production.   ?Cardiovascular: Denies chest pain, chest tightness, palpitations or swelling in the hands or feet.  ?Musculoskeletal: Patient reports left calf pain and swelling.  Denies decrease in range of motion, difficulty with gait, or joint swelling.  ?Skin: Denies redness, rashes, lesions or ulcercations.  ?Neurological: Denies numbness, tingling, weakness or problems  with balance and coordination.  ? ? ?No other specific complaints in a complete review of systems (except as listed in HPI above). ? ?Objective:  ? Physical Exam ? ?BP 106/68 (BP Location: Right Arm, Patient Position: Sitting, Cuff Size: Normal)   Pulse 77   Temp (!) 96.6 ?F (35.9 ?C) (Temporal)   Wt 143 lb (64.9 kg)   SpO2 99%   BMI 23.80 kg/m?  ? ?Wt Readings from Last 3 Encounters:  ?03/18/21 140 lb (63.5 kg)  ?01/21/21 147 lb (66.7 kg)  ?04/25/20 128 lb (58.1 kg)  ? ? ?General: Appears her stated age, well developed, well nourished in NAD. ?Skin: Warm, dry and intact. No redness, warmth or swellling noted. ?Cardiovascular: Normal rate. Negative Homan's ?Pulmonary/Chest: Normal effort and positive vesicular breath sounds.  ?Musculoskeletal: Normal flexion and extension of the left knee but pain with full extension. Pain with palpation of the calf but no swelling noted. She can stand on tiptoes and heels but painful. Limping gait. ?Neurological: Alert and oriented.  ? ?BMET ?   ?Component Value Date/Time  ? NA 141 01/21/2021 0841  ? K 4.3 01/21/2021 0841  ? CL 110 01/21/2021 0841  ? CO2 23 01/21/2021 0841  ? GLUCOSE 79 01/21/2021 0841  ? BUN 14 01/21/2021 0841  ? CREATININE 0.76 01/21/2021 0841  ? CALCIUM 9.0 01/21/2021 0841  ? GFRNONAA 91 01/21/2021 0841  ? GFRAA 106 01/21/2021 0841  ? ? ?Lipid Panel  ?   ?Component Value Date/Time  ? CHOL 181 01/21/2021 0841  ? TRIG 51 01/21/2021 0841  ? HDL 64 01/21/2021 0841  ? CHOLHDL 2.8 01/21/2021 0841  ? VLDL 14.6 01/15/2020 1545  ? LDLCALC 104 (H) 01/21/2021 0841  ? ? ?CBC ?   ?Component Value Date/Time  ? WBC 5.0 01/21/2021 0841  ? RBC 4.40 01/21/2021 0841  ? HGB 13.7 01/21/2021 0841  ? HCT 42.1 01/21/2021 0841  ? PLT 301 01/21/2021 0841  ? MCV 95.7 01/21/2021 0841  ? MCH 31.1 01/21/2021 0841  ? MCHC 32.5 01/21/2021 0841  ? RDW 12.4 01/21/2021 0841  ? ? ?Hgb A1C ?No results found for: HGBA1C ? ? ? ? ? ?   ?Assessment & Plan:  ?Left Calf Pain: ? ?Discussed DVT vs  gastrocnemius tear ?She is not concerned about DVT at this time ?Will obtain US lower extremity soft tissue, non vascular for further evaluation ?Continue stretching, elevation, alternate heat and ice ?Could consider referral to PT for further evaluation vs CAM boot for rest ? ?Will  follow up after imaging with further recommendation and treatment plan ? ? ?Return precautions discussed ?Webb Silversmith, NP ?This visit occurred during the SARS-CoV-2 public health  emergency.  Safety protocols were in place, including screening questions prior to the visit, additional usage of staff PPE, and extensive cleaning of exam room while observing appropriate contact time as indicated for disinfecting solutions.  ? ?

## 2021-10-06 NOTE — Patient Instructions (Signed)
Medial Head Gastrocnemius Tear ?Medial head gastrocnemius tear, also called tennis leg, is an injury to the inner (medial) part of the calf muscle. This injury may include overstretching of the muscle or a partial or complete tear. This is a common sports injury. Calf muscle tears usually occur near the back of the knee. This often causes sudden pain and muscle weakness. ?What are the causes? ?This condition is caused by forceful stretching or strain on the calf muscle. This usually happens when you forcefully push off of your foot. It may also happen if you forcefully straighten your knee while your foot is flat on the ground. ?What increases the risk? ?The following factors may make you more likely to develop this condition: ?Being female and older than age 70. ?Playing sports that involve: ?Quick increases in speed and changes of direction, such as in tennis and soccer. ?Jumping. ?Running, especially uphill or on uneven ground. ?What are the signs or symptoms? ?Symptoms of this condition include: ?Sudden pain in the back of the leg. You may hear a noise, like a pop or a snap at the time of injury. ?Pain that gets worse when you bring your toes up or when you straighten your knee. ?Pain on the inside of your calf, from your knee to your ankle. ?Pain when pressing on your calf muscle. ?Swelling and bruising along your calf and lower leg, down to your ankle. This may worsen for the first 2 days before getting better. ?Not being able to rise up on your toes, or pain when doing so. ?Difficulty pushing off your foot when walking or using stairs. ?How is this diagnosed? ?This condition may be diagnosed based on: ?Your symptoms and medical history. ?A physical exam. Your health care provider may be able to feel a lump or a defect in your muscle. ?An MRI or ultrasound to determine the severity and exact location of your injury. ?How is this treated? ?Treatment for this condition may include: ?Resting the muscle and keeping  weight off your leg for several days. During this time, you may use crutches or another walking device. ?Using a splint to keep your ankle or knee in a stable position. ?Wearing a walking boot to decrease the use of your gastrocnemius muscle. ?Using a wedge under your heel to reduce stretching of your healing muscle. ?Wearing a compression sleeve around your calf muscle. ?Taking medicine for pain and swelling, such as NSAIDs or steroids. ?Taking medicine for muscle spasms. ?Follow these instructions at home: ?Medicines ?Take over-the-counter and prescription medicines only as told by your health care provider. ?Ask your health care provider if the medicine prescribed to you requires you to avoid driving or using heavy machinery. ?Talk with your health care provider before you take any medicines that contain aspirin. Aspirin increases your risk for bleeding at the injured area. ?If you have a removable splint, boot, or compression sleeve: ?Wear it as told by your health care provider. Remove it only as told by your health care provider. ?Check the skin around it every day. Tell your health care provider about any concerns. ?Loosen the splint, boot, or sleeve if your toes tingle, become numb, or turn cold and blue. ?Keep the splint, boot, or sleeve clean and dry. ?If the splint, boot, or sleeve is not waterproof: ?Do not let it get wet. ?Cover it with a watertight covering when you take a bath or shower. ?Managing pain, stiffness, and swelling ? ?If directed, put ice on the injured area. ?If you  have a removable splint, boot, or sleeve, remove it as told by your health care provider. ?Put ice in a plastic bag. ?Place a towel between your skin and the bag. ?Leave the ice on for 20 minutes, 2-3 times a day. ?Remove the ice if your skin turns bright red. This is very important. If you cannot feel pain, heat, or cold, you have a greater risk of damage to the area. ?Move your toes often to reduce stiffness and  swelling. ?Elevate the injured area above the level of your heart while you are sitting or lying down. ?Activity ?Return to your normal activities as told by your health care provider. Ask your health care provider what activities are safe for you. ?Do not use the injured limb to support your body weight until your health care provider says that you can. Use crutches as told by your health care provider. ?Do leg exercises as told by your health care provider or physical therapist. ?Return to sporting activity gradually and only as told by your health care provider or physical therapist. Full recovery may take several months. ?General instructions ?Ask your health care provider when it is safe to drive. ?Do not use any products that contain nicotine or tobacco. These products include cigarettes, chewing tobacco, and vaping devices, such as e-cigarettes. If you need help quitting, ask your health care provider. ?Keep all follow-up visits. This is important. ?How is this prevented? ?Warm up and stretch before being active. ?Cool down and stretch after being active. ?Give your body time to rest between periods of activity. ?Maintain physical fitness, including: ?Strength. ?Flexibility. ?Contact a health care provider if: ?Your symptoms do not improve with rest and treatment. ?Get help right away if: ?You have swelling or redness in your calf that is getting worse. ?Your skin or toenails turn blue or gray, feel cold, or become numb. ?Summary ?Medial head gastrocnemius tear, also called tennis leg, is an injury to the inner part of the calf muscle. ?Follow instructions as told by your health care provider for resting, icing, compressing, and elevating your leg. ?Take over-the-counter and prescription medicines only as told by your health care provider. ?Contact a health care provider if your symptoms do not improve with rest and treatment. ?This information is not intended to replace advice given to you by your health  care provider. Make sure you discuss any questions you have with your health care provider. ?Document Revised: 12/05/2020 Document Reviewed: 12/05/2020 ?Elsevier Patient Education ? [place]. ? ?

## 2021-10-07 DIAGNOSIS — S86112A Strain of other muscle(s) and tendon(s) of posterior muscle group at lower leg level, left leg, initial encounter: Secondary | ICD-10-CM | POA: Diagnosis not present

## 2021-10-07 MED ORDER — CYCLOBENZAPRINE HCL 5 MG PO TABS: 5.0000 mg | ORAL_TABLET | Freq: Three times a day (TID) | ORAL | 0 refills | Status: DC | PRN

## 2021-10-10 ENCOUNTER — Other Ambulatory Visit: Payer: BC Managed Care – PPO

## 2021-10-15 ENCOUNTER — Ambulatory Visit: Payer: BC Managed Care – PPO

## 2021-11-11 DIAGNOSIS — S86911A Strain of unspecified muscle(s) and tendon(s) at lower leg level, right leg, initial encounter: Secondary | ICD-10-CM | POA: Diagnosis not present

## 2022-01-12 ENCOUNTER — Other Ambulatory Visit: Payer: Self-pay | Admitting: Internal Medicine

## 2022-01-12 DIAGNOSIS — G43019 Migraine without aura, intractable, without status migrainosus: Secondary | ICD-10-CM

## 2022-01-21 ENCOUNTER — Other Ambulatory Visit: Payer: Self-pay | Admitting: Internal Medicine

## 2022-01-21 DIAGNOSIS — Z1231 Encounter for screening mammogram for malignant neoplasm of breast: Secondary | ICD-10-CM

## 2022-02-07 ENCOUNTER — Other Ambulatory Visit: Payer: Self-pay | Admitting: Internal Medicine

## 2022-02-07 DIAGNOSIS — G43019 Migraine without aura, intractable, without status migrainosus: Secondary | ICD-10-CM

## 2022-02-09 NOTE — Telephone Encounter (Signed)
Requested medication (s) are due for refill today - expired Rx  Requested medication (s) are on the active medication list -yes  Future visit scheduled -yes  Last refill: 01/21/21 #10 11RF  Notes to clinic: expired Rx  Requested Prescriptions  Pending Prescriptions Disp Refills   rizatriptan (MAXALT) 10 MG tablet [Pharmacy Med Name: RIZATRIPTAN 10 MG TABLET] 8 tablet 14    Sig: Take 1 tablet (10 mg total) by mouth 3 (three) times daily as needed for migraine.     Neurology:  Migraine Therapy - Triptan Passed - 02/07/2022  8:42 AM      Passed - Last BP in normal range    BP Readings from Last 1 Encounters:  10/06/21 106/68         Passed - Valid encounter within last 12 months    Recent Outpatient Visits           4 months ago Pain of left calf   [facility] [place], Coralie Keens, NP   1 year ago Encounter for general adult medical examination with abnormal findings   [facility], Coralie Keens, NP       Future Appointments             In 1 week Jearld Fenton, NP [facility] [place]   In 2 weeks Ralene Bathe, MD [place]               Requested Prescriptions  Pending Prescriptions Disp Refills   rizatriptan (MAXALT) 10 MG tablet [Pharmacy Med Name: RIZATRIPTAN 10 MG TABLET] 8 tablet 14    Sig: Take 1 tablet (10 mg total) by mouth 3 (three) times daily as needed for migraine.     Neurology:  Migraine Therapy - Triptan Passed - 02/07/2022  8:42 AM      Passed - Last BP in normal range    BP Readings from Last 1 Encounters:  10/06/21 106/68         Passed - Valid encounter within last 12 months    Recent Outpatient Visits           4 months ago Pain of left calf   [facility], Coralie Keens, NP   1 year ago Encounter for general adult medical examination with abnormal findings   [facility], Coralie Keens, NP       Future Appointments             In 1 week  Garnette Gunner, Coralie Keens, NP [facility] [place]   In 2 weeks Ralene Bathe, MD [place]

## 2022-02-17 ENCOUNTER — Encounter: Payer: Self-pay | Admitting: Internal Medicine

## 2022-02-17 ENCOUNTER — Ambulatory Visit (INDEPENDENT_AMBULATORY_CARE_PROVIDER_SITE_OTHER): Payer: BC Managed Care – PPO | Admitting: Internal Medicine

## 2022-02-17 VITALS — BP 106/64 | HR 61 | Temp 96.6°F | Ht 65.0 in | Wt 140.0 lb

## 2022-02-17 DIAGNOSIS — G43019 Migraine without aura, intractable, without status migrainosus: Secondary | ICD-10-CM

## 2022-02-17 DIAGNOSIS — Z0001 Encounter for general adult medical examination with abnormal findings: Secondary | ICD-10-CM | POA: Diagnosis not present

## 2022-02-17 DIAGNOSIS — K5909 Other constipation: Secondary | ICD-10-CM | POA: Diagnosis not present

## 2022-02-17 MED ORDER — RIZATRIPTAN BENZOATE 10 MG PO TABS: 10.0000 mg | ORAL_TABLET | Freq: Three times a day (TID) | ORAL | 2 refills | Status: DC | PRN

## 2022-02-17 MED ORDER — TOPIRAMATE 50 MG PO TABS: 50.0000 mg | ORAL_TABLET | Freq: Every day | ORAL | 3 refills | Status: DC

## 2022-02-17 NOTE — Assessment & Plan Note (Signed)
Encouraged high fiber diet and adequate water intake 

## 2022-02-17 NOTE — Patient Instructions (Signed)
Health Maintenance for Postmenopausal Women Menopause is a normal process in which your ability to get pregnant comes to an end. This process happens slowly over many months or years, usually between the ages of 48 and 55. Menopause is complete when you have missed your menstrual period for 12 months. It is important to talk with your health care provider about some of the most common conditions that affect women after menopause (postmenopausal women). These include heart disease, cancer, and bone loss (osteoporosis). Adopting a healthy lifestyle and getting preventive care can help to promote your health and wellness. The actions you take can also lower your chances of developing some of these common conditions. What are the signs and symptoms of menopause? During menopause, you may have the following symptoms: Hot flashes. These can be moderate or severe. Night sweats. Decrease in sex drive. Mood swings. Headaches. Tiredness (fatigue). Irritability. Memory problems. Problems falling asleep or staying asleep. Talk with your health care provider about treatment options for your symptoms. Do I need hormone replacement therapy? Hormone replacement therapy is effective in treating symptoms that are caused by menopause, such as hot flashes and night sweats. Hormone replacement carries certain risks, especially as you become older. If you are thinking about using estrogen or estrogen with progestin, discuss the benefits and risks with your health care provider. How can I reduce my risk for heart disease and stroke? The risk of heart disease, heart attack, and stroke increases as you age. One of the causes may be a change in the body's hormones during menopause. This can affect how your body uses dietary fats, triglycerides, and cholesterol. Heart attack and stroke are medical emergencies. There are many things that you can do to help prevent heart disease and stroke. Watch your blood pressure High  blood pressure causes heart disease and increases the risk of stroke. This is more likely to develop in people who have high blood pressure readings or are overweight. Have your blood pressure checked: Every 3-5 years if you are 18-39 years of age. Every year if you are 40 years old or older. Eat a healthy diet  Eat a diet that includes plenty of vegetables, fruits, low-fat dairy products, and lean protein. Do not eat a lot of foods that are high in solid fats, added sugars, or sodium. Get regular exercise Get regular exercise. This is one of the most important things you can do for your health. Most adults should: Try to exercise for at least 150 minutes each week. The exercise should increase your heart rate and make you sweat (moderate-intensity exercise). Try to do strengthening exercises at least twice each week. Do these in addition to the moderate-intensity exercise. Spend less time sitting. Even light physical activity can be beneficial. Other tips Work with your health care provider to achieve or maintain a healthy weight. Do not use any products that contain nicotine or tobacco. These products include cigarettes, chewing tobacco, and vaping devices, such as e-cigarettes. If you need help quitting, ask your health care provider. Know your numbers. Ask your health care provider to check your cholesterol and your blood sugar (glucose). Continue to have your blood tested as directed by your health care provider. Do I need screening for cancer? Depending on your health history and family history, you may need to have cancer screenings at different stages of your life. This may include screening for: Breast cancer. Cervical cancer. Lung cancer. Colorectal cancer. What is my risk for osteoporosis? After menopause, you may be   at increased risk for osteoporosis. Osteoporosis is a condition in which bone destruction happens more quickly than new bone creation. To help prevent osteoporosis or  the bone fractures that can happen because of osteoporosis, you may take the following actions: If you are 19-50 years old, get at least 1,000 mg of calcium and at least 600 international units (IU) of vitamin D per day. If you are older than age 50 but younger than age 70, get at least 1,200 mg of calcium and at least 600 international units (IU) of vitamin D per day. If you are older than age 70, get at least 1,200 mg of calcium and at least 800 international units (IU) of vitamin D per day. Smoking and drinking excessive alcohol increase the risk of osteoporosis. Eat foods that are rich in calcium and vitamin D, and do weight-bearing exercises several times each week as directed by your health care provider. How does menopause affect my mental health? Depression may occur at any age, but it is more common as you become older. Common symptoms of depression include: Feeling depressed. Changes in sleep patterns. Changes in appetite or eating patterns. Feeling an overall lack of motivation or enjoyment of activities that you previously enjoyed. Frequent crying spells. Talk with your health care provider if you think that you are experiencing any of these symptoms. General instructions See your health care provider for regular wellness exams and vaccines. This may include: Scheduling regular health, dental, and eye exams. Getting and maintaining your vaccines. These include: Influenza vaccine. Get this vaccine each year before the flu season begins. Pneumonia vaccine. Shingles vaccine. Tetanus, diphtheria, and pertussis (Tdap) booster vaccine. Your health care provider may also recommend other immunizations. Tell your health care provider if you have ever been abused or do not feel safe at home. Summary Menopause is a normal process in which your ability to get pregnant comes to an end. This condition causes hot flashes, night sweats, decreased interest in sex, mood swings, headaches, or lack  of sleep. Treatment for this condition may include hormone replacement therapy. Take actions to keep yourself healthy, including exercising regularly, eating a healthy diet, watching your weight, and checking your blood pressure and blood sugar levels. Get screened for cancer and depression. Make sure that you are up to date with all your vaccines. This information is not intended to replace advice given to you by your health care provider. Make sure you discuss any questions you have with your health care provider. Document Revised: 11/25/2020 Document Reviewed: 11/25/2020 Elsevier Patient Education  2023 [employer]  

## 2022-02-17 NOTE — Assessment & Plan Note (Signed)
Will decrease Topamax to 50 mg daily, refilled Maxalt refilled today Will monitor

## 2022-02-17 NOTE — Progress Notes (Signed)
Subjective:    Patient ID: Emily Dalton, female    DOB: 1971/02/05, 51 y.o.   MRN: [id number]  HPI  Patient presents to clinic today for her annual exam.  Migraines: These rarely occur on the Topamax. She takes Maxalt as needed for breakthrough. She does not follow with neurology.  Flu: 03/2019 Tetanus: 01/2021  COVID: never Shingrix: never Pap smear: Hysterectomy Mammogram: 02/2021, scheduled Colon screening: 02/2021 Vision screening: annually Dentist: biannually  Diet: She does eat meat. She consumes fruits and veggies. She tries to avoid fried foods. She drinks mostly water, coffee. Exercise: Weight machine x 7 days  Review of Systems     Past Medical History:  Diagnosis Date   Chicken pox    Dysplastic nevus 02/28/2020   left low back paraspinal, excised 04/30/20   Dysplastic nevus 25/11/3974   right umbilicus, right shoulder, right breast, right epigastric - mild    Dysplastic nevus 12/05/2020   left mid side, moderate atypia    Dysplastic nevus 12/05/2020   left anterior mid to distal thigh, severe atypia, close to margin   Migraines     Current Outpatient Medications  Medication Sig Dispense Refill   acetaminophen (TYLENOL) 500 MG tablet Take 500 mg by mouth as needed for headache.     Cholecalciferol (VITAMIN D-3) 25 MCG (1000 UT) CAPS Take by mouth.     clindamycin (CLEOCIN-T) 1 % lotion Apply to aa's chest QHS. 60 mL 0   cyclobenzaprine (FLEXERIL) 5 MG tablet Take 1 tablet (5 mg total) by mouth 3 (three) times daily as needed for muscle spasms. 15 tablet 0   Ginkgo Biloba (GINKOBA PO) Take 120 mg by mouth daily.     ibuprofen (ADVIL,MOTRIN) 200 MG tablet Take 800 mg by mouth as needed for headache.      Magnesium 400 MG CAPS Take 1 capsule by mouth daily.     rizatriptan (MAXALT) 10 MG tablet TAKE 1 TABLET (10 MG TOTAL) BY MOUTH 3 (THREE) TIMES DAILY AS NEEDED FOR MIGRAINE. 10 tablet 2   topiramate (TOPAMAX) 50 MG tablet TAKE 1 TABLET BY MOUTH TWICE A DAY 180  tablet 0   vitamin B-12 (CYANOCOBALAMIN) 1000 MCG tablet Take 1,000 mcg by mouth daily.     zinc gluconate 50 MG tablet Take 50 mg by mouth daily.     No current facility-administered medications for this visit.    Allergies  Allergen Reactions   Morphine And Related Other (See Comments)    Many years ago--does not remember     Family History  Problem Relation Age of Onset   Hypertension Mother    Diabetes Mother    Arthritis Father    Hypertension Maternal Uncle    Hypertension Maternal Grandmother    Hypertension Maternal Grandfather    Cancer Paternal Grandfather    Breast cancer Neg Hx     Social History   Socioeconomic History   Marital status: Married    Spouse name: Not on file   Number of children: Not on file   Years of education: Not on file   Highest education level: Not on file  Occupational History   Not on file  Tobacco Use   Smoking status: Former    Types: E-cigarettes   Smokeless tobacco: Never   Tobacco comments:    01/2019  Substance and Sexual Activity   Alcohol use: No    Alcohol/week: 0.0 standard drinks of alcohol   Drug use: No   Sexual activity: Yes  Other Topics Concern   Not on file  Social History Narrative   Not on file   Social Determinants of Health   Financial Resource Strain: Not on file  Food Insecurity: Not on file  Transportation Needs: Not on file  Physical Activity: Not on file  Stress: Not on file  Social Connections: Not on file  Intimate Partner Violence: Not on file     Constitutional: Patient reports intermittent headaches.  Denies fever, malaise, fatigue, or abrupt weight changes.  HEENT: Denies eye pain, eye redness, ear pain, ringing in the ears, wax buildup, runny nose, nasal congestion, bloody nose, or sore throat. Respiratory: Denies difficulty breathing, shortness of breath, cough or sputum production.   Cardiovascular: Denies chest pain, chest tightness, palpitations or swelling in the hands or feet.   Gastrointestinal: Patient reports intermittent constipation.  Denies abdominal pain, bloating, diarrhea or blood in the stool.  GU: Denies urgency, frequency, pain with urination, burning sensation, blood in urine, odor or discharge. Musculoskeletal: Denies decrease in range of motion, difficulty with gait, muscle pain or joint pain and swelling.  Skin: Denies redness, rashes, lesions or ulcercations.  Neurological: Denies dizziness, difficulty with memory, difficulty with speech or problems with balance and coordination.  Psych: Denies anxiety, depression, SI/HI.  No other specific complaints in a complete review of systems (except as listed in HPI above).  Objective:   Physical Exam  BP 106/64 (BP Location: Left Arm, Patient Position: Sitting, Cuff Size: Normal)   Pulse 61   Temp (!) 96.6 F (35.9 C) (Temporal)   Ht 5' 5" (1.651 m)   Wt 140 lb (63.5 kg)   SpO2 100%   BMI 23.30 kg/m   Wt Readings from Last 3 Encounters:  10/06/21 143 lb (64.9 kg)  03/18/21 140 lb (63.5 kg)  01/21/21 147 lb (66.7 kg)    General: Appears her stated age, well developed, well nourished in NAD. Skin: Warm, dry and intact. HEENT: Head: normal shape and size; Eyes: sclera white, no icterus, conjunctiva pink, PERRLA and EOMs intact;  Neck:  Neck supple, trachea midline. No masses, lumps or thyromegaly present.  Cardiovascular: Normal rate and rhythm. S1,S2 noted.  No murmur, rubs or gallops noted. No JVD or BLE edema. No carotid bruits noted. Pulmonary/Chest: Normal effort and positive vesicular breath sounds. No respiratory distress. No wheezes, rales or ronchi noted.  Abdomen: Normal bowel sounds. Musculoskeletal: Strength 5/5 BUE/BLE. No difficulty with gait.  Neurological: Alert and oriented. Cranial nerves II-XII grossly intact. Coordination normal.  Psychiatric: Mood and affect normal. Behavior is normal. Judgment and thought content normal.     BMET    Component Value Date/Time   NA 141  01/21/2021 0841   K 4.3 01/21/2021 0841   CL 110 01/21/2021 0841   CO2 23 01/21/2021 0841   GLUCOSE 79 01/21/2021 0841   BUN 14 01/21/2021 0841   CREATININE 0.76 01/21/2021 0841   CALCIUM 9.0 01/21/2021 0841   GFRNONAA 91 01/21/2021 0841   GFRAA 106 01/21/2021 0841    Lipid Panel     Component Value Date/Time   CHOL 181 01/21/2021 0841   TRIG 51 01/21/2021 0841   HDL 64 01/21/2021 0841   CHOLHDL 2.8 01/21/2021 0841   VLDL 14.6 01/15/2020 1545   LDLCALC 104 (H) 01/21/2021 0841    CBC    Component Value Date/Time   WBC 5.0 01/21/2021 0841   RBC 4.40 01/21/2021 0841   HGB 13.7 01/21/2021 0841   HCT 42.1 01/21/2021 0841  PLT 301 01/21/2021 0841   MCV 95.7 01/21/2021 0841   MCH 31.1 01/21/2021 0841   MCHC 32.5 01/21/2021 0841   RDW 12.4 01/21/2021 0841    Hgb A1C No results found for: "HGBA1C"        Assessment & Plan:   Preventative Health Maintenance:  Encouraged her to get a flu shot in fall Tetanus UTD Encouraged her to get a COVID-vaccine Discussed Shingrix vaccine, she will check coverage with her insurance company and schedule a nurse visit if she would like to have this done She no longer needs Pap smears Mammogram scheduled Colon screening UTD Encouraged her to consume a balanced diet and exercise regimen Advised her to see an eye doctor and dentist annually We will check CBC, c-Met, lipid profile  RTC in 1 year for your annual exam Webb Silversmith, NP

## 2022-02-18 ENCOUNTER — Encounter: Payer: Self-pay | Admitting: Internal Medicine

## 2022-02-18 LAB — COMPLETE METABOLIC PANEL WITH GFR
AG Ratio: 2.1 (calc) (ref 1.0–2.5)
ALT: 11 U/L (ref 6–29)
AST: 12 U/L (ref 10–35)
Albumin: 4 g/dL (ref 3.6–5.1)
Alkaline phosphatase (APISO): 53 U/L (ref 37–153)
BUN: 15 mg/dL (ref 7–25)
CO2: 24 mmol/L (ref 20–32)
Calcium: 8.9 mg/dL (ref 8.6–10.4)
Chloride: 109 mmol/L (ref 98–110)
Creat: 0.85 mg/dL (ref 0.50–1.03)
Globulin: 1.9 g/dL (calc) (ref 1.9–3.7)
Glucose, Bld: 78 mg/dL (ref 65–99)
Potassium: 4.3 mmol/L (ref 3.5–5.3)
Sodium: 141 mmol/L (ref 135–146)
Total Bilirubin: 0.5 mg/dL (ref 0.2–1.2)
Total Protein: 5.9 g/dL — ABNORMAL LOW (ref 6.1–8.1)
eGFR: 83 mL/min/{1.73_m2} (ref >=60)

## 2022-02-18 LAB — CBC
HCT: 39.7 % (ref 35.0–45.0)
Hemoglobin: 13.2 g/dL (ref 11.7–15.5)
MCH: 31 pg (ref 27.0–33.0)
MCHC: 33.2 g/dL (ref 32.0–36.0)
MCV: 93.2 fL (ref 80.0–100.0)
MPV: 9.8 fL (ref 7.5–12.5)
Platelets: 302 10*3/uL (ref 140–400)
RBC: 4.26 10*6/uL (ref 3.80–5.10)
RDW: 12.9 % (ref 11.0–15.0)
WBC: 5.5 10*3/uL (ref 3.8–10.8)

## 2022-02-18 LAB — LIPID PANEL
Cholesterol: 217 mg/dL — ABNORMAL HIGH (ref <200)
HDL: 65 mg/dL (ref >=50)
LDL Cholesterol (Calc): 138 mg/dL (calc) — ABNORMAL HIGH
Non-HDL Cholesterol (Calc): 152 mg/dL (calc) — ABNORMAL HIGH (ref <130)
Total CHOL/HDL Ratio: 3.3 (calc) (ref <5.0)
Triglycerides: 52 mg/dL (ref <150)

## 2022-02-23 ENCOUNTER — Encounter: Payer: Self-pay | Admitting: Dermatology

## 2022-02-23 ENCOUNTER — Ambulatory Visit: Payer: BC Managed Care – PPO | Admitting: Dermatology

## 2022-02-23 DIAGNOSIS — Z1283 Encounter for screening for malignant neoplasm of skin: Secondary | ICD-10-CM

## 2022-02-23 DIAGNOSIS — Z86018 Personal history of other benign neoplasm: Secondary | ICD-10-CM | POA: Diagnosis not present

## 2022-02-23 DIAGNOSIS — D225 Melanocytic nevi of trunk: Secondary | ICD-10-CM

## 2022-02-23 DIAGNOSIS — L814 Other melanin hyperpigmentation: Secondary | ICD-10-CM | POA: Diagnosis not present

## 2022-02-23 DIAGNOSIS — L578 Other skin changes due to chronic exposure to nonionizing radiation: Secondary | ICD-10-CM | POA: Diagnosis not present

## 2022-02-23 DIAGNOSIS — D229 Melanocytic nevi, unspecified: Secondary | ICD-10-CM

## 2022-02-23 DIAGNOSIS — D489 Neoplasm of uncertain behavior, unspecified: Secondary | ICD-10-CM

## 2022-02-23 DIAGNOSIS — D239 Other benign neoplasm of skin, unspecified: Secondary | ICD-10-CM

## 2022-02-23 DIAGNOSIS — L821 Other seborrheic keratosis: Secondary | ICD-10-CM

## 2022-02-23 DIAGNOSIS — D1800 Hemangioma unspecified site: Secondary | ICD-10-CM

## 2022-02-23 NOTE — Progress Notes (Signed)
Follow-Up Visit   Subjective  Emily Dalton is a 51 y.o. female who presents for the following: Annual Exam (Here for skin cancer screening. Full body. Hx of dysplastic nevi, one severe. Areas of concern today). The patient presents for Total-Body Skin Exam (TBSE) for skin cancer screening and mole check.  The patient has spots, moles and lesions to be evaluated, some may be new or changing and the patient has concerns that these could be cancer.  The following portions of the chart were reviewed this encounter and updated as appropriate:  Tobacco  Allergies  Meds  Problems  Med Hx  Surg Hx  Fam Hx     Review of Systems: No other skin or systemic complaints except as noted in HPI or Assessment and Plan.  Objective  Well appearing patient in no apparent distress; mood and affect are within normal limits.  A full examination was performed including scalp, head, eyes, ears, nose, lips, neck, chest, axillae, abdomen, back, buttocks, bilateral upper extremities, bilateral lower extremities, hands, feet, fingers, toes, fingernails, and toenails. All findings within normal limits unless otherwise noted below.  Left Breast 0.4 x 0.2 medium brown irregular macule      Assessment & Plan   History of Dysplastic Nevi. Left anterior mid thigh, severe, 12/05/2020. - No evidence of recurrence today - Recommend regular full body skin exams - Recommend daily broad spectrum sunscreen SPF 30+ to sun-exposed areas, reapply every 2 hours as needed.  - Call if any new or changing lesions are noted between office visits   History of Dysplastic Nevi -see problem list - No evidence of recurrence today - Recommend regular full body skin exams - Recommend daily broad spectrum sunscreen SPF 30+ to sun-exposed areas, reapply every 2 hours as needed.  - Call if any new or changing lesions are noted between office visits  Lentigines - Scattered tan macules - Due to sun exposure - Benign-appearing,  observe - Recommend daily broad spectrum sunscreen SPF 30+ to sun-exposed areas, reapply every 2 hours as needed. - Call for any changes  Seborrheic Keratoses - Stuck-on, waxy, tan-brown papules and/or plaques  - Benign-appearing - Discussed benign etiology and prognosis. - Observe - Call for any changes  Melanocytic Nevi - Tan-brown and/or pink-flesh-colored symmetric macules and papules - Benign appearing on exam today - Observation - Call clinic for new or changing moles - Recommend daily use of broad spectrum spf 30+ sunscreen to sun-exposed areas.   Hemangiomas - Red papules - Discussed benign nature - Observe - Call for any changes  Actinic Damage - Chronic condition, secondary to cumulative UV/sun exposure - diffuse scaly erythematous macules with underlying dyspigmentation - Recommend daily broad spectrum sunscreen SPF 30+ to sun-exposed areas, reapply every 2 hours as needed.  - Staying in the shade or wearing long sleeves, sun glasses (UVA+UVB protection) and wide brim hats (4-inch brim around the entire circumference of the hat) are also recommended for sun protection.  - Call for new or changing lesions.  Skin cancer screening performed today.  Neoplasm of uncertain behavior Left Breast Epidermal / dermal shaving  Lesion diameter (cm):  0.4 Informed consent: discussed and consent obtained   Timeout: patient name, date of birth, surgical site, and procedure verified   Procedure prep:  Patient was prepped and draped in usual sterile fashion Prep type:  Isopropyl alcohol Anesthesia: the lesion was anesthetized in a standard fashion   Anesthetic:  1% lidocaine w/ epinephrine 1-100,000 buffered w/ 8.4% NaHCO3 Instrument  used: flexible razor blade   Hemostasis achieved with: pressure, aluminum chloride and electrodesiccation   Outcome: patient tolerated procedure well   Post-procedure details: sterile dressing applied and wound care instructions given   Dressing  type: bandage and petrolatum    Specimen 1 - Surgical pathology Differential Diagnosis: R/O atypia Check Margins: No  Compared to previous photo, no changes noted.   Return for TBSE in 6-12 months.  I, Emelia Salisbury, CMA, am acting as scribe for Sarina Ser, MD. Documentation: I have reviewed the above documentation for accuracy and completeness, and I agree with the above.  Sarina Ser, MD

## 2022-02-23 NOTE — Patient Instructions (Addendum)
Wound Care Instructions  Cleanse wound gently with soap and water once a day then pat dry with clean gauze. Apply a thin coat of Petrolatum (petroleum jelly, "Vaseline") over the wound (unless you have an allergy to this). We recommend that you use a new, sterile tube of Vaseline. Do not pick or remove scabs. Do not remove the yellow or white "healing tissue" from the base of the wound.  Cover the wound with fresh, clean, nonstick gauze and secure with paper tape. You may use Band-Aids in place of gauze and tape if the wound is small enough, but would recommend trimming much of the tape off as there is often too much. Sometimes Band-Aids can irritate the skin.  You should call the office for your biopsy report after 1 week if you have not already been contacted.  If you experience any problems, such as abnormal amounts of bleeding, swelling, significant bruising, significant pain, or evidence of infection, please call the office immediately.  FOR ADULT SURGERY PATIENTS: If you need something for pain relief you may take 1 extra strength Tylenol (acetaminophen) AND 2 Ibuprofen (200mg each) together every 4 hours as needed for pain. (do not take these if you are allergic to them or if you have a reason you should not take them.) Typically, you may only need pain medication for 1 to 3 days.      Recommend daily broad spectrum sunscreen SPF 30+ to sun-exposed areas, reapply every 2 hours as needed. Call for new or changing lesions.  Staying in the shade or wearing long sleeves, sun glasses (UVA+UVB protection) and wide brim hats (4-inch brim around the entire circumference of the hat) are also recommended for sun protection.    Melanoma ABCDEs  Melanoma is the most dangerous type of skin cancer, and is the leading cause of death from skin disease.  You are more likely to develop melanoma if you: Have light-colored skin, light-colored eyes, or red or blond hair Spend a lot of time in the sun Tan  regularly, either outdoors or in a tanning bed Have had blistering sunburns, especially during childhood Have a close family member who has had a melanoma Have atypical moles or large birthmarks  Early detection of melanoma is key since treatment is typically straightforward and cure rates are extremely high if we catch it early.   The first sign of melanoma is often a change in a mole or a new dark spot.  The ABCDE system is a way of remembering the signs of melanoma.  A for asymmetry:  The two halves do not match. B for border:  The edges of the growth are irregular. C for color:  A mixture of colors are present instead of an even brown color. D for diameter:  Melanomas are usually (but not always) greater than 6mm - the size of a pencil eraser. E for evolution:  The spot keeps changing in size, shape, and color.  Please check your skin once per month between visits. You can use a small mirror in front and a large mirror behind you to keep an eye on the back side or your body.   If you see any new or changing lesions before your next follow-up, please call to schedule a visit.  Please continue daily skin protection including broad spectrum sunscreen SPF 30+ to sun-exposed areas, reapplying every 2 hours as needed when you're outdoors.   Staying in the shade or wearing long sleeves, sun glasses (UVA+UVB protection) and wide   brim hats (4-inch brim around the entire circumference of the hat) are also recommended for sun protection.    Due to recent changes in healthcare laws, you may see results of your pathology and/or laboratory studies on MyChart before the doctors have had a chance to review them. We understand that in some cases there may be results that are confusing or concerning to you. Please understand that not all results are received at the same time and often the doctors may need to interpret multiple results in order to provide you with the best plan of care or course of  treatment. Therefore, we ask that you please give us 2 business days to thoroughly review all your results before contacting the office for clarification. Should we see a critical lab result, you will be contacted sooner.   If You Need Anything After Your Visit  If you have any questions or concerns for your doctor, please call our main line at [phone] and press option 4 to reach your doctor's medical assistant. If no one answers, please leave a voicemail as directed and we will return your call as soon as possible. Messages left after 4 pm will be answered the following business day.   You may also send us a message via MyChart. We typically respond to MyChart messages within 1-2 business days.  For prescription refills, please ask your pharmacy to contact our office. Our fax number is [phone].  If you have an urgent issue when the clinic is closed that cannot wait until the next business day, you can page your doctor at the number below.    Please note that while we do our best to be available for urgent issues outside of office hours, we are not available 24/7.   If you have an urgent issue and are unable to reach us, you may choose to seek medical care at your doctor's office, retail clinic, urgent care center, or emergency room.  If you have a medical emergency, please immediately call 911 or go to the emergency department.  Pager Numbers  - Dr. Kowalski: [phone]  - Dr. Moye: [phone]  - Dr. Stewart: [phone]  In the event of inclement weather, please call our main line at [phone] for an update on the status of any delays or closures.  Dermatology Medication Tips: Please keep the boxes that topical medications come in in order to help keep track of the instructions about where and how to use these. Pharmacies typically print the medication instructions only on the boxes and not directly on the medication tubes.   If your medication is too expensive,  please contact our office at [phone] option 4 or send us a message through MyChart.   We are unable to tell what your co-pay for medications will be in advance as this is different depending on your insurance coverage. However, we may be able to find a substitute medication at lower cost or fill out paperwork to get insurance to cover a needed medication.   If a prior authorization is required to get your medication covered by your insurance company, please allow us 1-2 business days to complete this process.  Drug prices often vary depending on where the prescription is filled and some pharmacies may offer cheaper prices.  The website www.goodrx.com contains coupons for medications through different pharmacies. The prices here do not account for what the cost may be with help from insurance (it may be cheaper with your insurance), but the website can give you   the price if you did not use any insurance.  - You can print the associated coupon and take it with your prescription to the pharmacy.  - You may also stop by our office during regular business hours and pick up a GoodRx coupon card.  - If you need your prescription sent electronically to a different pharmacy, notify our office through [facility] MyChart or by phone at [phone] option 4.     Si Usted Necesita Algo Despus de Su Visita  Tambin puede enviarnos un mensaje a travs de MyChart. Por lo general respondemos a los mensajes de MyChart en el transcurso de 1 a 2 das hbiles.  Para renovar recetas, por favor pida a su farmacia que se ponga en contacto con nuestra oficina. Nuestro nmero de fax es el [phone].  Si tiene un asunto urgente cuando la clnica est cerrada y que no puede esperar hasta el siguiente da hbil, puede llamar/localizar a su doctor(a) al nmero que aparece a continuacin.   Por favor, tenga en cuenta que aunque hacemos todo lo posible para estar disponibles para asuntos urgentes fuera del  horario de oficina, no estamos disponibles las 24 horas del da, los 7 das de la semana.   Si tiene un problema urgente y no puede comunicarse con nosotros, puede optar por buscar atencin mdica  en el consultorio de su doctor(a), en una clnica privada, en un centro de atencin urgente o en una sala de emergencias.  Si tiene una emergencia mdica, por favor llame inmediatamente al 911 o vaya a la sala de emergencias.  Nmeros de bper  - Dr. Kowalski: [phone]  - Dra. Moye: [phone]  - Dra. Stewart: [phone]  En caso de inclemencias del tiempo, por favor llame a nuestra lnea principal al [phone] para una actualizacin sobre el estado de cualquier retraso o cierre.  Consejos para la medicacin en dermatologa: Por favor, guarde las cajas en las que vienen los medicamentos de uso tpico para ayudarle a seguir las instrucciones sobre dnde y cmo usarlos. Las farmacias generalmente imprimen las instrucciones del medicamento slo en las cajas y no directamente en los tubos del medicamento.   Si su medicamento es muy caro, por favor, pngase en contacto con nuestra oficina llamando al [phone] y presione la opcin 4 o envenos un mensaje a travs de MyChart.   No podemos decirle cul ser su copago por los medicamentos por adelantado ya que esto es diferente dependiendo de la cobertura de su seguro. Sin embargo, es posible que podamos encontrar un medicamento sustituto a menor costo o llenar un formulario para que el seguro cubra el medicamento que se considera necesario.   Si se requiere una autorizacin previa para que su compaa de seguros cubra su medicamento, por favor permtanos de 1 a 2 das hbiles para completar este proceso.  Los precios de los medicamentos varan con frecuencia dependiendo del lugar de dnde se surte la receta y alguna farmacias pueden ofrecer precios ms baratos.  El sitio web www.goodrx.com tiene cupones para medicamentos de diferentes  farmacias. Los precios aqu no tienen en cuenta lo que podra costar con la ayuda del seguro (puede ser ms barato con su seguro), pero el sitio web puede darle el precio si no utiliz ningn seguro.  - Puede imprimir el cupn correspondiente y llevarlo con su receta a la farmacia.  - Tambin puede pasar por nuestra oficina durante el horario de atencin regular y recoger una tarjeta de cupones de GoodRx.  - Si necesita   Si necesita que su receta se enve electrnicamente a una farmacia diferente, informe a nuestra oficina a travs de MyChart de  o por telfono llamando al [phone] y presione la opcin 4.  

## 2022-02-25 ENCOUNTER — Telehealth: Payer: Self-pay

## 2022-02-25 NOTE — Telephone Encounter (Signed)
-----   Message from Ralene Bathe, MD sent at 02/25/2022  2:39 PM EDT ----- Diagnosis Skin , left breast MELANOCYTIC NEVUS, JUNCTIONAL LENTIGINOUS TYPE, LIMITED MARGINS FREE  Benign mole No further treatment needed

## 2022-02-25 NOTE — Telephone Encounter (Signed)
Advised patient of results/hd  

## 2022-03-06 ENCOUNTER — Ambulatory Visit
Admission: RE | Admit: 2022-03-06 | Discharge: 2022-03-06 | Disposition: A | Discharge status: Home / Self Care | Payer: BC Managed Care – PPO | Source: Ambulatory Visit | Attending: Internal Medicine | Admitting: Internal Medicine

## 2022-03-06 DIAGNOSIS — Z1231 Encounter for screening mammogram for malignant neoplasm of breast: Secondary | ICD-10-CM | POA: Insufficient documentation

## 2022-03-09 ENCOUNTER — Other Ambulatory Visit: Payer: Self-pay | Admitting: Internal Medicine

## 2022-03-09 DIAGNOSIS — N6489 Other specified disorders of breast: Secondary | ICD-10-CM

## 2022-03-09 DIAGNOSIS — R928 Other abnormal and inconclusive findings on diagnostic imaging of breast: Secondary | ICD-10-CM

## 2022-03-26 ENCOUNTER — Ambulatory Visit
Admission: RE | Admit: 2022-03-26 | Discharge: 2022-03-26 | Disposition: A | Discharge status: Home / Self Care | Payer: BC Managed Care – PPO | Source: Ambulatory Visit | Attending: Internal Medicine | Admitting: Internal Medicine

## 2022-03-26 DIAGNOSIS — R928 Other abnormal and inconclusive findings on diagnostic imaging of breast: Secondary | ICD-10-CM

## 2022-03-26 DIAGNOSIS — N6489 Other specified disorders of breast: Secondary | ICD-10-CM

## 2022-04-17 ENCOUNTER — Other Ambulatory Visit: Payer: Self-pay | Admitting: Internal Medicine

## 2022-04-17 DIAGNOSIS — G43019 Migraine without aura, intractable, without status migrainosus: Secondary | ICD-10-CM

## 2022-04-17 NOTE — Telephone Encounter (Signed)
Requested Prescriptions  Pending Prescriptions Disp Refills  . topiramate (TOPAMAX) 50 MG tablet [Pharmacy Med Name: TOPIRAMATE 50 MG TABLET] 180 tablet     Sig: TAKE 1 TABLET BY MOUTH TWICE A DAY     Neurology: Anticonvulsants - topiramate & zonisamide Passed - 04/17/2022  1:47 AM      Passed - Cr in normal range and within 360 days    Creat  Date Value Ref Range Status  02/17/2022 0.85 0.50 - 1.03 mg/dL Final         Passed - CO2 in normal range and within 360 days    CO2  Date Value Ref Range Status  02/17/2022 24 20 - 32 mmol/L Final         Passed - ALT in normal range and within 360 days    ALT  Date Value Ref Range Status  02/17/2022 11 6 - 29 U/L Final         Passed - AST in normal range and within 360 days    AST  Date Value Ref Range Status  02/17/2022 12 10 - 35 U/L Final         Passed - Completed PHQ-2 or PHQ-9 in the last 360 days      Passed - Valid encounter within last 12 months    Recent Outpatient Visits          1 month ago Encounter for general adult medical examination with abnormal findings   [facility] [place], Coralie Keens, NP   6 months ago Pain of left calf   [facility] [place], Coralie Keens, NP   1 year ago Encounter for general adult medical examination with abnormal findings   [facility] [place], Coralie Keens, NP      Future Appointments            In 9 months Ralene Bathe, MD [place]

## 2022-05-06 DIAGNOSIS — M5412 Radiculopathy, cervical region: Secondary | ICD-10-CM | POA: Diagnosis not present

## 2022-06-10 DIAGNOSIS — M5412 Radiculopathy, cervical region: Secondary | ICD-10-CM | POA: Diagnosis not present

## 2022-06-18 DIAGNOSIS — M5412 Radiculopathy, cervical region: Secondary | ICD-10-CM | POA: Diagnosis not present

## 2022-06-19 DIAGNOSIS — M5412 Radiculopathy, cervical region: Secondary | ICD-10-CM | POA: Diagnosis not present

## 2022-07-01 DIAGNOSIS — M5412 Radiculopathy, cervical region: Secondary | ICD-10-CM | POA: Diagnosis not present

## 2022-07-17 DIAGNOSIS — M5412 Radiculopathy, cervical region: Secondary | ICD-10-CM | POA: Diagnosis not present

## 2022-08-12 DIAGNOSIS — M5412 Radiculopathy, cervical region: Secondary | ICD-10-CM | POA: Diagnosis not present

## 2022-08-25 ENCOUNTER — Other Ambulatory Visit: Payer: Self-pay | Admitting: Internal Medicine

## 2022-08-25 DIAGNOSIS — G43019 Migraine without aura, intractable, without status migrainosus: Secondary | ICD-10-CM

## 2022-08-25 NOTE — Telephone Encounter (Signed)
Requested Prescriptions  Pending Prescriptions Disp Refills   rizatriptan (MAXALT) 10 MG tablet [Pharmacy Med Name: RIZATRIPTAN 10 MG TABLET] 10 tablet 2    Sig: TAKE 1 TABLET (10 MG TOTAL) BY MOUTH 3 (THREE) TIMES DAILY AS NEEDED FOR MIGRAINE.     Neurology:  Migraine Therapy - Triptan Passed - 08/25/2022  1:21 AM      Passed - Last BP in normal range    BP Readings from Last 1 Encounters:  02/17/22 106/64         Passed - Valid encounter within last 12 months    Recent Outpatient Visits           6 months ago Encounter for general adult medical examination with abnormal findings   [facility] [facility], Coralie Keens, NP   10 months ago Pain of left calf   [facility] [place], Coralie Keens, NP   1 year ago Encounter for general adult medical examination with abnormal findings   [facility] [place], Coralie Keens, NP       Future Appointments             In 5 months Ralene Bathe, MD [place]

## 2022-08-27 DIAGNOSIS — M5412 Radiculopathy, cervical region: Secondary | ICD-10-CM | POA: Diagnosis not present

## 2022-09-02 DIAGNOSIS — M5412 Radiculopathy, cervical region: Secondary | ICD-10-CM | POA: Diagnosis not present

## 2022-09-16 DIAGNOSIS — M5412 Radiculopathy, cervical region: Secondary | ICD-10-CM | POA: Diagnosis not present

## 2022-09-24 DIAGNOSIS — M5412 Radiculopathy, cervical region: Secondary | ICD-10-CM | POA: Diagnosis not present

## 2022-11-25 ENCOUNTER — Other Ambulatory Visit: Payer: Self-pay | Admitting: Internal Medicine

## 2022-11-25 DIAGNOSIS — G43019 Migraine without aura, intractable, without status migrainosus: Secondary | ICD-10-CM

## 2022-11-25 NOTE — Telephone Encounter (Signed)
Requested Prescriptions  Pending Prescriptions Disp Refills   rizatriptan (MAXALT) 10 MG tablet [Pharmacy Med Name: RIZATRIPTAN 10 MG TABLET] 10 tablet 2    Sig: TAKE 1 TABLET (10 MG TOTAL) BY MOUTH 3 (THREE) TIMES DAILY AS NEEDED FOR MIGRAINE.     Neurology:  Migraine Therapy - Triptan Passed - 11/25/2022  2:16 AM      Passed - Last BP in normal range    BP Readings from Last 1 Encounters:  02/17/22 106/64         Passed - Valid encounter within last 12 months    Recent Outpatient Visits           9 months ago Encounter for general adult medical examination with abnormal findings   [place] [facility] [place], Salvadore Oxford, NP   1 year ago Pain of left calf   [place] [place], Salvadore Oxford, NP   1 year ago Encounter for general adult medical examination with abnormal findings   [place] [facility] [place], Salvadore Oxford, NP       Future Appointments             In 2 months Deirdre Evener, MD [facility]

## 2022-12-29 ENCOUNTER — Ambulatory Visit: Payer: BC Managed Care – PPO

## 2022-12-29 ENCOUNTER — Ambulatory Visit
Admission: RE | Admit: 2022-12-29 | Discharge: 2022-12-29 | Disposition: A | Discharge status: Home / Self Care | Payer: BC Managed Care – PPO | Source: Ambulatory Visit | Attending: Nurse Practitioner | Admitting: Nurse Practitioner

## 2022-12-29 ENCOUNTER — Other Ambulatory Visit: Payer: Self-pay

## 2022-12-29 VITALS — BP 127/68 | HR 91 | Temp 97.9°F | Resp 18

## 2022-12-29 DIAGNOSIS — J069 Acute upper respiratory infection, unspecified: Secondary | ICD-10-CM

## 2022-12-29 MED ORDER — PROMETHAZINE-DM 6.25-15 MG/5ML PO SYRP
10.0000 mL | ORAL_SOLUTION | Freq: Four times a day (QID) | ORAL | 0 refills | Status: DC | PRN
Start: 2022-12-29 — End: 2023-03-17

## 2022-12-29 MED ORDER — METHYLPREDNISOLONE 4 MG PO TBPK: ORAL_TABLET | ORAL | 0 refills | Status: DC

## 2022-12-29 MED ORDER — AMOXICILLIN 875 MG PO TABS: 875.0000 mg | ORAL_TABLET | Freq: Two times a day (BID) | ORAL | 0 refills | Status: AC

## 2022-12-29 MED ORDER — MUCINEX DM MAXIMUM STRENGTH 60-1200 MG PO TB12: 1.0000 | ORAL_TABLET | Freq: Two times a day (BID) | ORAL | 0 refills | Status: DC

## 2022-12-29 NOTE — Discharge Instructions (Addendum)
Your symptoms are likely due to a viral respiratory infection. A respiratory infection is an illness that affects part of the respiratory system, such as the lungs, nose, or throat. Take medications as prescribed. You may take tylenol or ibuprofen as needed for fevers/headache/body aches. Drink plenty of fluids.

## 2022-12-29 NOTE — ED Provider Notes (Signed)
[facility]    CSN: [id number] Arrival date & time: 12/29/22  1155      History   Chief Complaint Chief Complaint  Patient presents with   Cough    HPI Emily Dalton is a 52 y.o. female.   Subjective:   Emily Dalton is a 52 y.o. female who presents for evaluation of symptoms of a URI. Onset of symptoms was 2 weeks ago and has been gradually worsening since that time.  Symptoms initially started with a sore throat and headache which is better.  She has also had intermittent postnasal drainage, runny nose and nasal congestion.  The cough has been ongoing since onset but worse over the past week.  The cough is productive with green/gray/yellow sputum.  She also has coughing "fits" that is hard to control.  Chest feels tight at times.  She denies any fevers, chills, wheezing or shortness of breath.  She has tried over-the-counter cold/flu medication as well as Delsym with minimal relief in the cough.  She does not smoke or vape. She is drinking plenty of fluids.   The following portions of the patient's history were reviewed and updated as appropriate: allergies, current medications, past family history, past medical history, past social history, past surgical history, and problem list.          Past Medical History:  Diagnosis Date   Chicken pox    Dysplastic nevus 02/28/2020   left low back paraspinal, excised 04/30/20   Dysplastic nevus 05/07/2020   right umbilicus, right shoulder, right breast, right epigastric - mild    Dysplastic nevus 12/05/2020   left mid side, moderate atypia    Dysplastic nevus 12/05/2020   left anterior mid to distal thigh, severe atypia, close to margin   Migraines     Patient Active Problem List   Diagnosis Date Noted   Chronic constipation 01/21/2021   Intractable migraine without aura and without status migrainosus 01/24/2015    Past Surgical History:  Procedure Laterality Date   ABDOMINAL HYSTERECTOMY  2005    AUGMENTATION MAMMAPLASTY Bilateral 2001; 2014    implants replaced 2014 saline anterior to implants   COLONOSCOPY N/A 03/18/2021   Procedure: COLONOSCOPY;  Surgeon: Toney Reil, MD;  Location: [facility] ENDOSCOPY;  Service: Gastroenterology;  Laterality: N/A;   MASTOPEXY     with implant removal   SPINAL FUSION  1986    OB History   No obstetric history on file.      Home Medications    Prior to Admission medications   Medication Sig Start Date End Date Taking? Authorizing Provider  amoxicillin (AMOXIL) 875 MG tablet Take 1 tablet (875 mg total) by mouth 2 (two) times daily for 7 days. 12/29/22 01/05/23 Yes Katelyne Galster, Lelon Mast, FNP  Dextromethorphan-guaiFENesin (MUCINEX DM MAXIMUM STRENGTH) 60-1200 MG TB12 Take 1 tablet by mouth 2 (two) times daily. 12/29/22  Yes Lurline Idol, FNP  methylPREDNISolone (MEDROL DOSEPAK) 4 MG TBPK tablet Take as directed 12/29/22  Yes Marquite Attwood, Lelon Mast, FNP  promethazine-dextromethorphan (PROMETHAZINE-DM) 6.25-15 MG/5ML syrup Take 10 mLs by mouth every 6 (six) hours as needed for cough. 12/29/22  Yes Lurline Idol, FNP  acetaminophen (TYLENOL) 500 MG tablet Take 500 mg by mouth as needed for headache.    [provider]  Cholecalciferol (VITAMIN D-3) 25 MCG (1000 UT) CAPS Take by mouth.    [provider]  clindamycin (CLEOCIN-T) 1 % lotion Apply to aa's chest QHS. 12/04/20   Deirdre Evener, MD  Ginkgo Heidi Dach [facility]  PO) Take 120 mg by mouth daily.    [provider]  ibuprofen (ADVIL,MOTRIN) 200 MG tablet Take 800 mg by mouth as needed for headache.     [provider]  Magnesium 400 MG CAPS Take 1 capsule by mouth daily.    [provider]  rizatriptan (MAXALT) 10 MG tablet TAKE 1 TABLET (10 MG TOTAL) BY MOUTH 3 (THREE) TIMES DAILY AS NEEDED FOR MIGRAINE. 11/25/22   Lorre Munroe, NP  topiramate (TOPAMAX) 50 MG tablet Take 1 tablet (50 mg total) by mouth daily. 02/17/22   Lorre Munroe, NP  vitamin B-12  (CYANOCOBALAMIN) 1000 MCG tablet Take 1,000 mcg by mouth daily.    [provider]  zinc gluconate 50 MG tablet Take 50 mg by mouth daily.    [provider]    Family History Family History  Problem Relation Age of Onset   Hypertension Mother    Diabetes Mother    Arthritis Father    Hypertension Maternal Uncle    Hypertension Maternal Grandmother    Hypertension Maternal Grandfather    Cancer Paternal Grandfather    Breast cancer Neg Hx     Social History Social History   Tobacco Use   Smoking status: Former    Types: E-cigarettes   Smokeless tobacco: Never   Tobacco comments:    01/2019  Vaping Use   Vaping Use: Former  Substance Use Topics   Alcohol use: No    Alcohol/week: 0.0 standard drinks of alcohol   Drug use: No     Allergies   Morphine and codeine   Review of Systems Review of Systems  Constitutional:  Positive for fatigue. Negative for activity change, appetite change, chills, diaphoresis and fever.  HENT:  Positive for congestion, postnasal drip, rhinorrhea and sore throat. Negative for ear pain, sinus pressure, sinus pain and trouble swallowing.   Respiratory:  Positive for cough. Negative for shortness of breath and wheezing.   Gastrointestinal:  Negative for diarrhea, nausea and vomiting.  Musculoskeletal:  Negative for myalgias.  Neurological:  Positive for headaches.  All other systems reviewed and are negative.    Physical Exam Triage Vital Signs ED Triage Vitals  Enc Vitals Group     BP 12/29/22 1252 127/68     Pulse Rate 12/29/22 1252 91     Resp 12/29/22 1252 18     Temp 12/29/22 1252 97.9 F (36.6 C)     Temp Source 12/29/22 1252 Oral     SpO2 12/29/22 1252 99 %     Weight --      Height --      Head Circumference --      Peak Flow --      Pain Score 12/29/22 1253 0     Pain Loc --      Pain Edu? --      Excl. in GC? --    No data found.  Updated Vital Signs BP 127/68 (BP Location: Right Arm)   Pulse  91   Temp 97.9 F (36.6 C) (Oral)   Resp 18   SpO2 99%   Visual Acuity Right Eye Distance:   Left Eye Distance:   Bilateral Distance:    Right Eye Near:   Left Eye Near:    Bilateral Near:     Physical Exam Vitals reviewed.  Constitutional:      General: She is not in acute distress.    Appearance: Normal appearance. She is normal weight. She  is not ill-appearing, toxic-appearing or diaphoretic.  HENT:     Head: Normocephalic.     Nose: Nose normal.  Eyes:     Conjunctiva/sclera: Conjunctivae normal.  Cardiovascular:     Rate and Rhythm: Normal rate and regular rhythm.  Pulmonary:     Effort: Pulmonary effort is normal.     Breath sounds: Normal breath sounds.  Musculoskeletal:        General: Normal range of motion.     Cervical back: Normal range of motion and neck supple.  Skin:    General: Skin is warm and dry.  Neurological:     General: No focal deficit present.     Mental Status: She is alert and oriented to person, place, and time.  Psychiatric:        Mood and Affect: Mood normal.        Behavior: Behavior normal.      UC Treatments / Results  Labs (all labs ordered are listed, but only abnormal results are displayed) Labs Reviewed - No data to display  EKG   Radiology No results found.  Procedures Procedures (including critical care time)  Medications Ordered in UC Medications - No data to display  Initial Impression / Assessment and Plan / UC Course  I have reviewed the triage vital signs and the nursing notes.  Pertinent labs & imaging results that were available during my care of the patient were reviewed by me and considered in my medical decision making (see chart for details).    52 year old female presenting with a URI.  She is afebrile.  Nontoxic.  Physical exam as above. Discussed diagnosis and treatment of URI. Suggested symptomatic OTC remedies. Follow up as needed.  Today's evaluation has revealed no signs of a dangerous  process. Discussed diagnosis with patient and/or guardian. Patient and/or guardian aware of their diagnosis, possible red flag symptoms to watch out for and need for close follow up. Patient and/or guardian understands verbal and written discharge instructions. Patient and/or guardian comfortable with plan and disposition.  Patient and/or guardian has a clear mental status at this time, good insight into illness (after discussion and teaching) and has clear judgment to make decisions regarding their care  Documentation was completed with the aid of voice recognition software. Transcription may contain typographical errors. Final Clinical Impressions(s) / UC Diagnoses   Final diagnoses:  Upper respiratory tract infection, unspecified type     Discharge Instructions      Your symptoms are likely due to a viral respiratory infection. A respiratory infection is an illness that affects part of the respiratory system, such as the lungs, nose, or throat. Take medications as prescribed. You may take tylenol or ibuprofen as needed for fevers/headache/body aches. Drink plenty of fluids.           ED Prescriptions     Medication Sig Dispense Auth. Provider   amoxicillin (AMOXIL) 875 MG tablet Take 1 tablet (875 mg total) by mouth 2 (two) times daily for 7 days. 14 tablet Oniya Mandarino, Leaf, FNP   methylPREDNISolone (MEDROL DOSEPAK) 4 MG TBPK tablet Take as directed 21 tablet Morene Cecilio, FNP   Dextromethorphan-guaiFENesin (MUCINEX DM MAXIMUM STRENGTH) 60-1200 MG TB12 Take 1 tablet by mouth 2 (two) times daily. 20 tablet Lurline Idol, FNP   promethazine-dextromethorphan (PROMETHAZINE-DM) 6.25-15 MG/5ML syrup Take 10 mLs by mouth every 6 (six) hours as needed for cough. 118 mL Lurline Idol, FNP      PDMP not reviewed this encounter.   Lacharles Altschuler,  Lelon Mast, FNP 12/29/22 1309

## 2022-12-29 NOTE — ED Triage Notes (Signed)
Pt here for cough x 2 weeks with some nasal congestion

## 2023-01-27 ENCOUNTER — Ambulatory Visit: Payer: BC Managed Care – PPO | Admitting: Dermatology

## 2023-02-16 IMAGING — MG MM DIGITAL DIAGNOSTIC UNILAT*L* W/ TOMO W/ CAD
6 series · 6 of 18 positions shown · non-contrast
Comparison: Previous exam(s).

CLINICAL DATA: Recall from screening mammogram for left breast
masses.

EXAM:
DIGITAL DIAGNOSTIC UNILATERAL LEFT MAMMOGRAM WITH TOMOSYNTHESIS AND
CAD; ULTRASOUND LEFT BREAST LIMITED
TECHNIQUE: Left digital diagnostic mammography and breast tomosynthesis was
performed. The images were evaluated with computer-aided detection.;
Targeted ultrasound examination of the left breast was performed.

[L ML synth-2D]
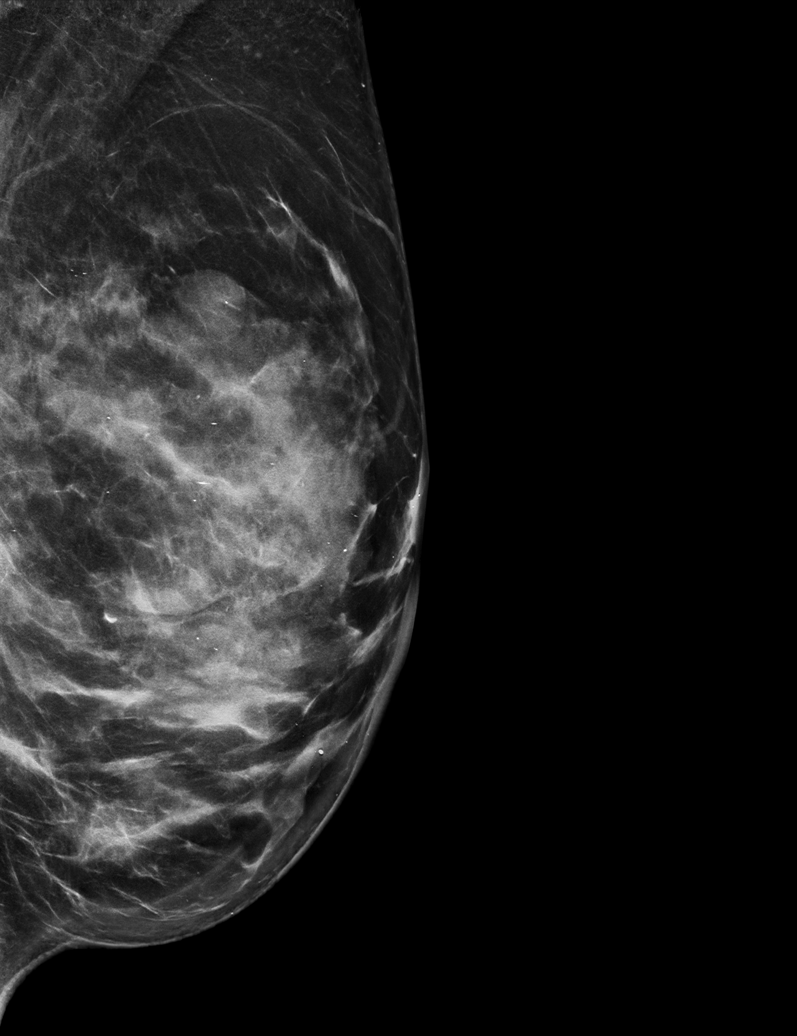

[L MLO synth-2D]
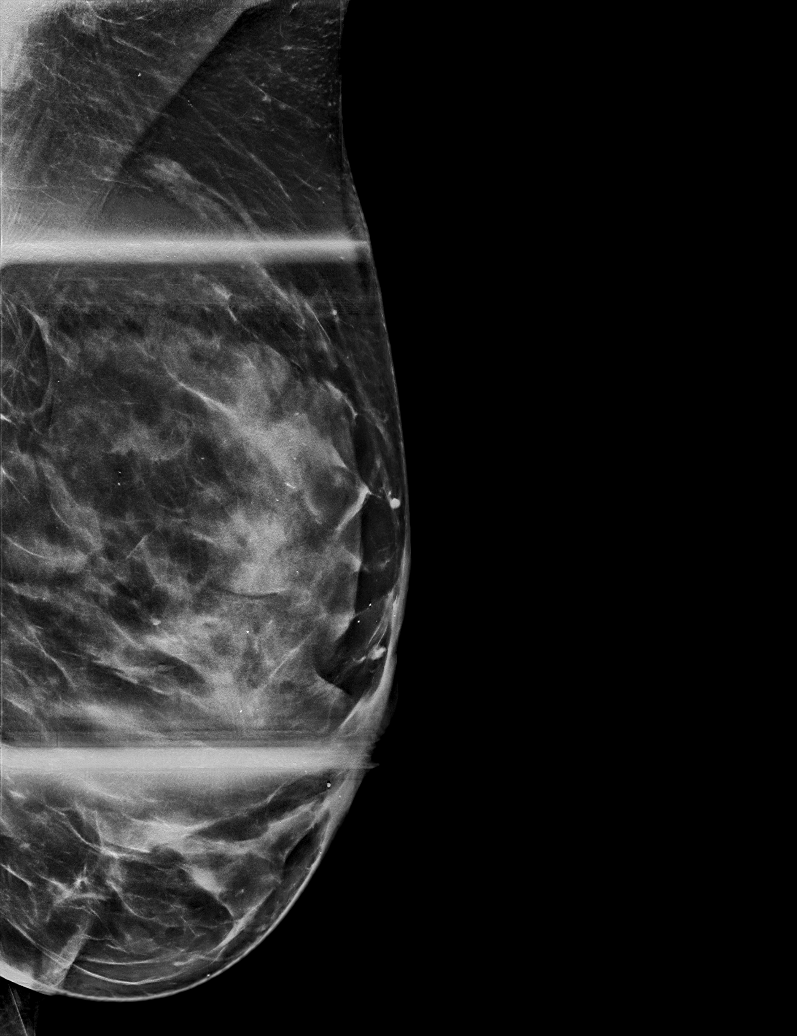

[L CC synth-2D]
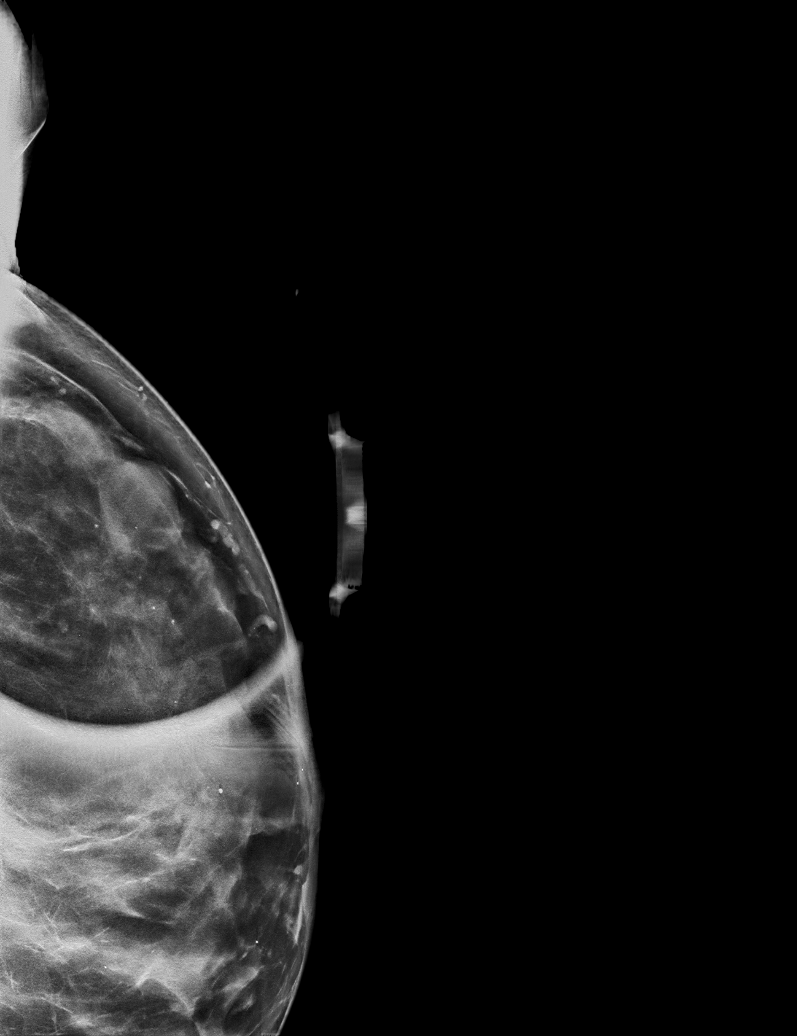

[L ML tomo · tomo slice 39/77.0]
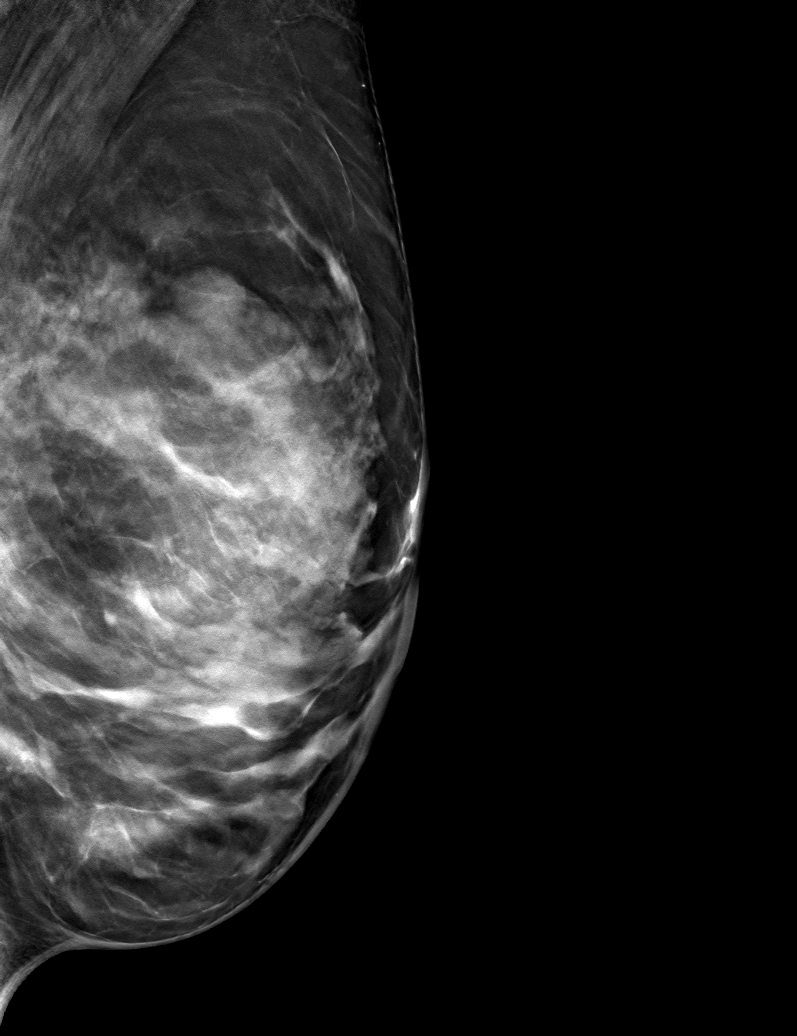

[L MLO tomo · tomo slice 42/83.0]
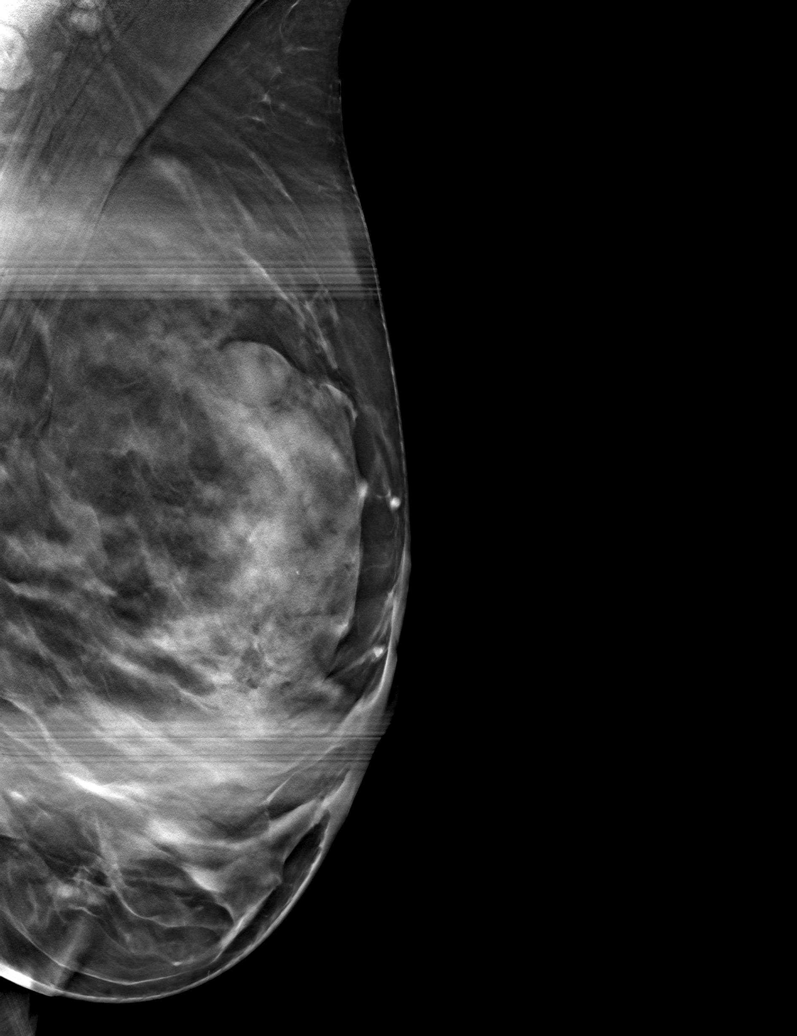

[L CC tomo · tomo slice 39/78.0]
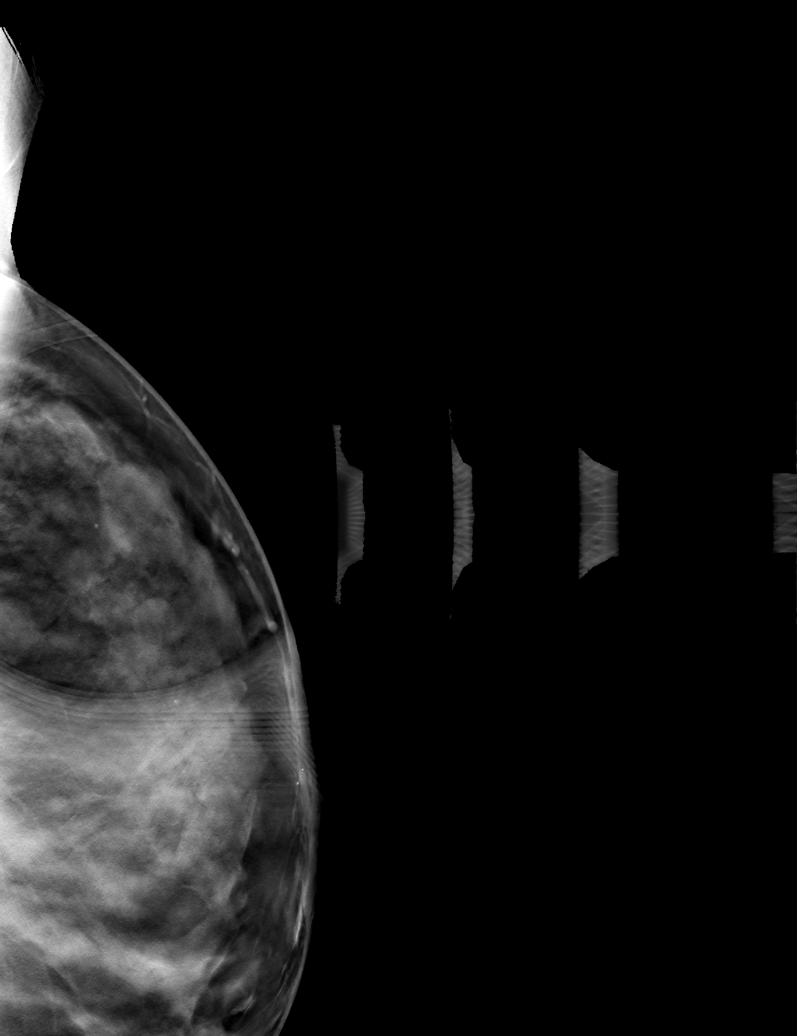

[6 of 18 positions shown; findings below may reference images not displayed]

ACR Breast Density Category d: The breast tissue is extremely dense,
which lowers the sensitivity of mammography.
FINDINGS: Interval removal of subglandular breast implant since prior
screening mammogram 07/26/2018. The 2 possible masses noted on
screening mammogram persists on additional views as a 2 cm oval mass
with obscured margins at the 3 o'clock position middle depth and a
1.5 cm oval mass with obscured margins at the 1 o'clock position
middle depth.

Targeted ultrasound of the left breast at the 1 o'clock position 4
cm from the nipple demonstrates a 1.3 x 1.2 by 1.5 cm round anechoic
circumscribed simple cyst, corresponding to the mammographic
finding.

Targeted ultrasound of the left breast at the 3 o'clock position 4
cm from the nipple demonstrates a 2.1 x 1.5 by 1.8 cm round anechoic
circumscribed simple cyst, corresponding to the mammographic
finding.
IMPRESSION: Benign simple cysts measuring up to 2.1 cm are noted at the left
breast 1 o'clock and 3 o'clock positions, corresponding to the
findings on recent screening mammogram.

RECOMMENDATION:
Annual screening mammogram.

I have discussed the findings and recommendations with the patient.
If applicable, a reminder letter will be sent to the patient
regarding the next appointment.

BI-RADS CATEGORY  2: Benign.

## 2023-03-15 ENCOUNTER — Other Ambulatory Visit: Payer: Self-pay | Admitting: Internal Medicine

## 2023-03-15 DIAGNOSIS — Z1231 Encounter for screening mammogram for malignant neoplasm of breast: Secondary | ICD-10-CM

## 2023-03-17 ENCOUNTER — Encounter: Payer: Self-pay | Admitting: Internal Medicine

## 2023-03-17 ENCOUNTER — Ambulatory Visit (INDEPENDENT_AMBULATORY_CARE_PROVIDER_SITE_OTHER): Payer: BC Managed Care – PPO | Admitting: Internal Medicine

## 2023-03-17 VITALS — BP 104/60 | HR 66 | Temp 96.0°F | Ht 65.0 in | Wt 145.0 lb

## 2023-03-17 DIAGNOSIS — R7309 Other abnormal glucose: Secondary | ICD-10-CM | POA: Diagnosis not present

## 2023-03-17 DIAGNOSIS — E7800 Pure hypercholesterolemia, unspecified: Secondary | ICD-10-CM | POA: Diagnosis not present

## 2023-03-17 DIAGNOSIS — Z0001 Encounter for general adult medical examination with abnormal findings: Secondary | ICD-10-CM | POA: Diagnosis not present

## 2023-03-17 DIAGNOSIS — G43019 Migraine without aura, intractable, without status migrainosus: Secondary | ICD-10-CM | POA: Diagnosis not present

## 2023-03-17 DIAGNOSIS — K5909 Other constipation: Secondary | ICD-10-CM

## 2023-03-17 MED ORDER — TOPIRAMATE 50 MG PO TABS: 50.0000 mg | ORAL_TABLET | Freq: Every day | ORAL | 3 refills | Status: DC

## 2023-03-17 MED ORDER — RIZATRIPTAN BENZOATE 10 MG PO TABS: 10.0000 mg | ORAL_TABLET | Freq: Three times a day (TID) | ORAL | 5 refills | Status: DC | PRN

## 2023-03-17 NOTE — Assessment & Plan Note (Signed)
Encouraged high fiber diet and adequate water intake 

## 2023-03-17 NOTE — Patient Instructions (Signed)

## 2023-03-17 NOTE — Progress Notes (Signed)
Subjective:    Patient ID: Emily Dalton, female    DOB: [birth year], 52 y.o.   MRN: [id number]  HPI  Patient presents to clinic today for her annual exam.  Flu: 03/2019 Tetanus: 01/2021 COVID: never Shingrix: never Pap smear: hysterectomy Mammogram: 03/2022 Colon screening: 02/2021 Vision screening: annually Dentist: biannually  Diet: She does eat meat. She consumes fruits and veggies. She tries to avoid fried foods. She drinks mostly water, coffee, smoothies. Exercise: weights  Review of Systems     Past Medical History:  Diagnosis Date   Chicken pox    Dysplastic nevus 02/28/2020   left low back paraspinal, excised 04/30/20   Dysplastic nevus 05/07/2020   right umbilicus, right shoulder, right breast, right epigastric - mild    Dysplastic nevus 12/05/2020   left mid side, moderate atypia    Dysplastic nevus 12/05/2020   left anterior mid to distal thigh, severe atypia, close to margin   Migraines     Current Outpatient Medications  Medication Sig Dispense Refill   acetaminophen (TYLENOL) 500 MG tablet Take 500 mg by mouth as needed for headache.     Cholecalciferol (VITAMIN D-3) 25 MCG (1000 UT) CAPS Take by mouth.     clindamycin (CLEOCIN-T) 1 % lotion Apply to aa's chest QHS. 60 mL 0   Dextromethorphan-guaiFENesin (MUCINEX DM MAXIMUM STRENGTH) 60-1200 MG TB12 Take 1 tablet by mouth 2 (two) times daily. 20 tablet 0   Ginkgo Biloba (GINKOBA PO) Take 120 mg by mouth daily.     ibuprofen (ADVIL,MOTRIN) 200 MG tablet Take 800 mg by mouth as needed for headache.      Magnesium 400 MG CAPS Take 1 capsule by mouth daily.     methylPREDNISolone (MEDROL DOSEPAK) 4 MG TBPK tablet Take as directed 21 tablet 0   promethazine-dextromethorphan (PROMETHAZINE-DM) 6.25-15 MG/5ML syrup Take 10 mLs by mouth every 6 (six) hours as needed for cough. 118 mL 0   rizatriptan (MAXALT) 10 MG tablet TAKE 1 TABLET (10 MG TOTAL) BY MOUTH 3 (THREE) TIMES DAILY AS NEEDED FOR MIGRAINE. 10 tablet 2    topiramate (TOPAMAX) 50 MG tablet Take 1 tablet (50 mg total) by mouth daily. 90 tablet 3   vitamin B-12 (CYANOCOBALAMIN) 1000 MCG tablet Take 1,000 mcg by mouth daily.     zinc gluconate 50 MG tablet Take 50 mg by mouth daily.     No current facility-administered medications for this visit.    Allergies  Allergen Reactions   Morphine And Codeine Other (See Comments)    Many years ago--does not remember     Family History  Problem Relation Age of Onset   Hypertension Mother    Diabetes Mother    Arthritis Father    Hypertension Maternal Uncle    Hypertension Maternal Grandmother    Hypertension Maternal Grandfather    Cancer Paternal Grandfather    Breast cancer Neg Hx     Social History   Socioeconomic History   Marital status: Married    Spouse name: Not on file   Number of children: Not on file   Years of education: Not on file   Highest education level: Not on file  Occupational History   Not on file  Tobacco Use   Smoking status: Former    Types: E-cigarettes   Smokeless tobacco: Never   Tobacco comments:    01/2019  Vaping Use   Vaping status: Former  Substance and Sexual Activity   Alcohol use: No    Alcohol/week:  0.0 standard drinks of alcohol   Drug use: No   Sexual activity: Yes  Other Topics Concern   Not on file  Social History Narrative   Not on file   Social Determinants of Health   Financial Resource Strain: Not on file  Food Insecurity: Not on file  Transportation Needs: Not on file  Physical Activity: Not on file  Stress: Not on file  Social Connections: Not on file  Intimate Partner Violence: Not on file     Constitutional: Patient reports intermittent headaches.  Denies fever, malaise, fatigue, or abrupt weight changes.  HEENT: Denies eye pain, eye redness, ear pain, ringing in the ears, wax buildup, runny nose, nasal congestion, bloody nose, or sore throat. Respiratory: Denies difficulty breathing, shortness of breath, cough or  sputum production.   Cardiovascular: Denies chest pain, chest tightness, palpitations or swelling in the hands or feet.  Gastrointestinal: Patient has a history of constipation.  Denies abdominal pain, bloating, diarrhea or blood in the stool.  GU: Denies urgency, frequency, pain with urination, burning sensation, blood in urine, odor or discharge. Musculoskeletal: Denies decrease in range of motion, difficulty with gait, muscle pain or joint pain and swelling.  Skin: Denies redness, rashes, lesions or ulcercations.  Neurological: Denies dizziness, difficulty with memory, difficulty with speech or problems with balance and coordination.  Psych: Denies anxiety, depression, SI/HI.  No other specific complaints in a complete review of systems (except as listed in HPI above).  Objective:   Physical Exam   BP 104/60 (BP Location: Left Arm, Patient Position: Sitting, Cuff Size: Normal)   Pulse 66   Temp (!) 96 F (35.6 C) (Temporal)   Ht 5\' 5"  (1.651 m)   Wt 145 lb (65.8 kg)   SpO2 98%   BMI 24.13 kg/m   Wt Readings from Last 3 Encounters:  02/17/22 140 lb (63.5 kg)  10/06/21 143 lb (64.9 kg)  03/18/21 140 lb (63.5 kg)    General: Appears her stated age, well developed, well nourished in NAD. Skin: Warm, dry and intact.  Bruise noted to right upper extremity. HEENT: Head: normal shape and size; Eyes: sclera white, no icterus, conjunctiva pink, PERRLA and EOMs intact;  Neck:  Neck supple, trachea midline. No masses, lumps or thyromegaly present.  Cardiovascular: Normal rate and rhythm. S1,S2 noted.  No murmur, rubs or gallops noted. No JVD or BLE edema. No carotid bruits noted. Pulmonary/Chest: Normal effort and positive vesicular breath sounds. No respiratory distress. No wheezes, rales or ronchi noted.  Abdomen: Normal bowel sounds.  Musculoskeletal: Strength 5/5 BUE/BLE.  No difficulty with gait.  Neurological: Alert and oriented. Cranial nerves II-XII grossly intact. Coordination  normal.  Psychiatric: Mood and affect normal. Behavior is normal. Judgment and thought content normal.     BMET    Component Value Date/Time   NA 141 02/17/2022 0853   K 4.3 02/17/2022 0853   CL 109 02/17/2022 0853   CO2 24 02/17/2022 0853   GLUCOSE 78 02/17/2022 0853   BUN 15 02/17/2022 0853   CREATININE 0.85 02/17/2022 0853   CALCIUM 8.9 02/17/2022 0853   GFRNONAA 91 01/21/2021 0841   GFRAA 106 01/21/2021 0841    Lipid Panel     Component Value Date/Time   CHOL 217 (H) 02/17/2022 0853   TRIG 52 02/17/2022 0853   HDL 65 02/17/2022 0853   CHOLHDL 3.3 02/17/2022 0853   VLDL 14.6 01/15/2020 1545   LDLCALC 138 (H) 02/17/2022 0853    CBC  Component Value Date/Time   WBC 5.5 02/17/2022 0853   RBC 4.26 02/17/2022 0853   HGB 13.2 02/17/2022 0853   HCT 39.7 02/17/2022 0853   PLT 302 02/17/2022 0853   MCV 93.2 02/17/2022 0853   MCH 31.0 02/17/2022 0853   MCHC 33.2 02/17/2022 0853   RDW 12.9 02/17/2022 0853    Hgb A1C No results found for: "HGBA1C"         Assessment & Plan:   Preventative health maintenance:  Encouraged her to get a flu shot in the fall Tetanus UTD Encouraged her to get her COVID-vaccine Discussed Shingrix vaccine, she will check coverage with her insurance company and schedule visit she would like to have this done She no longer needs to screen for cervical cancer Mammogram has already been scheduled Colon screening UTD Encouraged her to consume a balanced diet and exercise regimen Advised her to see an eye doctor and dentist annually We will check CBC, c-Met, lipid, A1c today  RTC in 1 year for annual exam.  Advised her of cholesterol was elevated I would like to see her back in 6 months for follow-up of chronic conditions Nicki Reaper, NP

## 2023-03-17 NOTE — Assessment & Plan Note (Signed)
C-Met and lipid profile today Try to consume a low-fat diet

## 2023-03-17 NOTE — Assessment & Plan Note (Signed)
Rizatriptan and Topamax refilled today

## 2023-03-18 LAB — COMPLETE METABOLIC PANEL WITH GFR
AG Ratio: 2 (calc) (ref 1.0–2.5)
ALT: 11 U/L (ref 6–29)
AST: 10 U/L (ref 10–35)
Albumin: 4 g/dL (ref 3.6–5.1)
Alkaline phosphatase (APISO): 54 U/L (ref 37–153)
BUN: 17 mg/dL (ref 7–25)
CO2: 24 mmol/L (ref 20–32)
Calcium: 8.9 mg/dL (ref 8.6–10.4)
Chloride: 110 mmol/L (ref 98–110)
Creat: 0.85 mg/dL (ref 0.50–1.03)
Globulin: 2 g/dL (ref 1.9–3.7)
Glucose, Bld: 77 mg/dL (ref 65–99)
Potassium: 4.5 mmol/L (ref 3.5–5.3)
Sodium: 140 mmol/L (ref 135–146)
Total Bilirubin: 0.4 mg/dL (ref 0.2–1.2)
Total Protein: 6 g/dL — ABNORMAL LOW (ref 6.1–8.1)
eGFR: 82 mL/min/{1.73_m2} (ref >=60)

## 2023-03-18 LAB — LIPID PANEL
Cholesterol: 194 mg/dL (ref <200)
HDL: 71 mg/dL (ref >=50)
LDL Cholesterol (Calc): 108 mg/dL — ABNORMAL HIGH
Non-HDL Cholesterol (Calc): 123 mg/dL (ref <130)
Total CHOL/HDL Ratio: 2.7 (calc) (ref <5.0)
Triglycerides: 65 mg/dL (ref <150)

## 2023-03-18 LAB — CBC
HCT: 40.2 % (ref 35.0–45.0)
Hemoglobin: 13 g/dL (ref 11.7–15.5)
MCH: 30.4 pg (ref 27.0–33.0)
MCHC: 32.3 g/dL (ref 32.0–36.0)
MCV: 93.9 fL (ref 80.0–100.0)
MPV: 10.2 fL (ref 7.5–12.5)
Platelets: 268 10*3/uL (ref 140–400)
RBC: 4.28 10*6/uL (ref 3.80–5.10)
RDW: 12.3 % (ref 11.0–15.0)
WBC: 5.5 10*3/uL (ref 3.8–10.8)

## 2023-03-18 LAB — HEMOGLOBIN A1C
Hgb A1c MFr Bld: 5.6 %{Hb} (ref <5.7)
Mean Plasma Glucose: 114 mg/dL
eAG (mmol/L): 6.3 mmol/L

## 2023-04-20 ENCOUNTER — Ambulatory Visit
Admission: RE | Admit: 2023-04-20 | Discharge: 2023-04-20 | Disposition: A | Discharge status: Home / Self Care | Payer: BC Managed Care – PPO | Source: Ambulatory Visit | Attending: Internal Medicine | Admitting: Internal Medicine

## 2023-04-20 DIAGNOSIS — Z1231 Encounter for screening mammogram for malignant neoplasm of breast: Secondary | ICD-10-CM | POA: Insufficient documentation

## 2023-04-21 ENCOUNTER — Other Ambulatory Visit: Payer: Self-pay | Admitting: Internal Medicine

## 2023-04-21 DIAGNOSIS — R928 Other abnormal and inconclusive findings on diagnostic imaging of breast: Secondary | ICD-10-CM

## 2023-05-04 ENCOUNTER — Ambulatory Visit
Admission: RE | Admit: 2023-05-04 | Discharge: 2023-05-04 | Disposition: A | Discharge status: Home / Self Care | Payer: BC Managed Care – PPO | Source: Ambulatory Visit | Attending: Internal Medicine | Admitting: Internal Medicine

## 2023-05-04 DIAGNOSIS — R928 Other abnormal and inconclusive findings on diagnostic imaging of breast: Secondary | ICD-10-CM

## 2023-05-04 DIAGNOSIS — N6313 Unspecified lump in the right breast, lower outer quadrant: Secondary | ICD-10-CM | POA: Diagnosis not present

## 2023-05-04 DIAGNOSIS — N6001 Solitary cyst of right breast: Secondary | ICD-10-CM | POA: Diagnosis not present

## 2023-05-04 DIAGNOSIS — R92333 Mammographic heterogeneous density, bilateral breasts: Secondary | ICD-10-CM | POA: Diagnosis not present

## 2023-09-21 ENCOUNTER — Other Ambulatory Visit: Payer: Self-pay | Admitting: Internal Medicine

## 2023-09-21 DIAGNOSIS — Z0001 Encounter for general adult medical examination with abnormal findings: Secondary | ICD-10-CM

## 2023-09-22 NOTE — Telephone Encounter (Signed)
 Requested Prescriptions  Pending Prescriptions Disp Refills   rizatriptan (MAXALT) 10 MG tablet [Pharmacy Med Name: RIZATRIPTAN 10 MG TABLET] 10 tablet 5    Sig: TAKE 1 TABLET BY MOUTH 3 TIMES A DAY AS NEEDED FOR MIGRAINE     Neurology:  Migraine Therapy - Triptan Passed - 09/22/2023  8:19 AM      Passed - Last BP in normal range    BP Readings from Last 1 Encounters:  03/17/23 104/60         Passed - Valid encounter within last 12 months    Recent Outpatient Visits           6 months ago Encounter for general adult medical examination with abnormal findings   [place] [facility] [place], Salvadore Oxford, NP   1 year ago Encounter for general adult medical examination with abnormal findings   [place] [facility] [place], Salvadore Oxford, NP   1 year ago Pain of left calf   [place] [facility] [place], Salvadore Oxford, NP   2 years ago Encounter for general adult medical examination with abnormal findings   [place] [facility] [place], Salvadore Oxford, NP

## 2023-11-29 ENCOUNTER — Ambulatory Visit: Admitting: Dermatology

## 2023-11-29 ENCOUNTER — Encounter: Payer: Self-pay | Admitting: Dermatology

## 2023-11-29 DIAGNOSIS — Z7189 Other specified counseling: Secondary | ICD-10-CM

## 2023-11-29 DIAGNOSIS — L814 Other melanin hyperpigmentation: Secondary | ICD-10-CM

## 2023-11-29 DIAGNOSIS — L820 Inflamed seborrheic keratosis: Secondary | ICD-10-CM

## 2023-11-29 DIAGNOSIS — L578 Other skin changes due to chronic exposure to nonionizing radiation: Secondary | ICD-10-CM

## 2023-11-29 DIAGNOSIS — D229 Melanocytic nevi, unspecified: Secondary | ICD-10-CM | POA: Diagnosis not present

## 2023-11-29 DIAGNOSIS — L858 Other specified epidermal thickening: Secondary | ICD-10-CM

## 2023-11-29 DIAGNOSIS — Z79899 Other long term (current) drug therapy: Secondary | ICD-10-CM

## 2023-11-29 DIAGNOSIS — Z1283 Encounter for screening for malignant neoplasm of skin: Secondary | ICD-10-CM | POA: Diagnosis not present

## 2023-11-29 DIAGNOSIS — Z86018 Personal history of other benign neoplasm: Secondary | ICD-10-CM

## 2023-11-29 DIAGNOSIS — L821 Other seborrheic keratosis: Secondary | ICD-10-CM

## 2023-11-29 NOTE — Patient Instructions (Addendum)
 Cryotherapy Aftercare  Wash gently with soap and water everyday.   Apply Vaseline Jelly daily until healed.     Recommend daily broad spectrum sunscreen SPF 30+ to sun-exposed areas, reapply every 2 hours as needed. Call for new or changing lesions.  Staying in the shade or wearing long sleeves, sun glasses (UVA+UVB protection) and wide brim hats (4-inch brim around the entire circumference of the hat) are also recommended for sun protection.    Keratosis Pilaris: Recommend starting moisturizer with exfoliant (Urea, Salicylic acid, or Lactic acid) one to two times daily to help smooth rough and bumpy skin.  OTC options include Cetaphil Rough and Bumpy lotion (Urea), Eucerin Roughness Relief lotion or spot treatment cream (Urea), CeraVe SA lotion/cream for Rough and Bumpy skin (Sal Acid), Gold Bond Rough and Bumpy cream (Sal Acid), and AmLactin 12% lotion/cream (Lactic Acid).  If applying in morning, also apply sunscreen to sun-exposed areas, since these exfoliating moisturizers can increase sensitivity to sun.    Melanoma ABCDEs  Melanoma is the most dangerous type of skin cancer, and is the leading cause of death from skin disease.  You are more likely to develop melanoma if you: Have light-colored skin, light-colored eyes, or red or blond hair Spend a lot of time in the sun Tan regularly, either outdoors or in a tanning bed Have had blistering sunburns, especially during childhood Have a close family member who has had a melanoma Have atypical moles or large birthmarks  Early detection of melanoma is key since treatment is typically straightforward and cure rates are extremely high if we catch it early.   The first sign of melanoma is often a change in a mole or a new dark spot.  The ABCDE system is a way of remembering the signs of melanoma.  A for asymmetry:  The two halves do not match. B for border:  The edges of the growth are irregular. C for color:  A mixture of colors are  present instead of an even brown color. D for diameter:  Melanomas are usually (but not always) greater than 6mm - the size of a pencil eraser. E for evolution:  The spot keeps changing in size, shape, and color.  Please check your skin once per month between visits. You can use a small mirror in front and a large mirror behind you to keep an eye on the back side or your body.   If you see any new or changing lesions before your next follow-up, please call to schedule a visit.  Please continue daily skin protection including broad spectrum sunscreen SPF 30+ to sun-exposed areas, reapplying every 2 hours as needed when you're outdoors.   Staying in the shade or wearing long sleeves, sun glasses (UVA+UVB protection) and wide brim hats (4-inch brim around the entire circumference of the hat) are also recommended for sun protection.     Due to recent changes in healthcare laws, you may see results of your pathology and/or laboratory studies on MyChart before the doctors have had a chance to review them. We understand that in some cases there may be results that are confusing or concerning to you. Please understand that not all results are received at the same time and often the doctors may need to interpret multiple results in order to provide you with the best plan of care or course of treatment. Therefore, we ask that you please give us  2 business days to thoroughly review all your results before contacting the office for  clarification. Should we see a critical lab result, you will be contacted sooner.   If You Need Anything After Your Visit  If you have any questions or concerns for your doctor, please call our main line at [phone] and press option 4 to reach your doctor's medical assistant. If no one answers, please leave a voicemail as directed and we will return your call as soon as possible. Messages left after 4 pm will be answered the following business day.   You may also send us  a  message via MyChart. We typically respond to MyChart messages within 1-2 business days.  For prescription refills, please ask your pharmacy to contact our office. Our fax number is [phone].  If you have an urgent issue when the clinic is closed that cannot wait until the next business day, you can page your doctor at the number below.    Please note that while we do our best to be available for urgent issues outside of office hours, we are not available 24/7.   If you have an urgent issue and are unable to reach us , you may choose to seek medical care at your doctor's office, retail clinic, urgent care center, or emergency room.  If you have a medical emergency, please immediately call 911 or go to the emergency department.  Pager Numbers  - Dr. Bary Likes: [phone]  - Dr. Annette Barters: [phone]  - Dr. Felipe Horton: [phone]   In the event of inclement weather, please call our main line at [phone] for an update on the status of any delays or closures.  Dermatology Medication Tips: Please keep the boxes that topical medications come in in order to help keep track of the instructions about where and how to use these. Pharmacies typically print the medication instructions only on the boxes and not directly on the medication tubes.   If your medication is too expensive, please contact our office at [phone] option 4 or send us  a message through MyChart.   We are unable to tell what your co-pay for medications will be in advance as this is different depending on your insurance coverage. However, we may be able to find a substitute medication at lower cost or fill out paperwork to get insurance to cover a needed medication.   If a prior authorization is required to get your medication covered by your insurance company, please allow us  1-2 business days to complete this process.  Drug prices often vary depending on where the prescription is filled and some pharmacies may offer  cheaper prices.  The website www.goodrx.com contains coupons for medications through different pharmacies. The prices here do not account for what the cost may be with help from insurance (it may be cheaper with your insurance), but the website can give you the price if you did not use any insurance.  - You can print the associated coupon and take it with your prescription to the pharmacy.  - You may also stop by our office during regular business hours and pick up a GoodRx coupon card.  - If you need your prescription sent electronically to a different pharmacy, notify our office through [facility] or by phone at [phone] option 4.     Si Usted Necesita Algo Despus de Su Visita  Tambin puede enviarnos un mensaje a travs de Clinical cytogeneticist. Por lo general respondemos a los mensajes de MyChart en el transcurso de 1 a 2 das hbiles.  Para renovar recetas, por favor pida a su farmacia que  se ponga en contacto con nuestra oficina. Franz Jacks de fax es [place] [phone].  Si tiene un asunto urgente cuando la clnica est cerrada y que no puede esperar hasta el siguiente da hbil, puede llamar/localizar a su doctor(a) al nmero que aparece a continuacin.   Por favor, tenga en cuenta que aunque hacemos todo lo posible para estar disponibles para asuntos urgentes fuera del horario de [place], no estamos disponibles las 24 horas del da, los 7 [address]  [address] de la [place].   Si tiene un problema urgente y no puede comunicarse con nosotros, puede optar por buscar atencin mdica  en el consultorio de su doctor(a), en una clnica privada, en un centro de atencin urgente o en una sala de emergencias.  Si tiene Engineer, drilling, por favor llame inmediatamente al 911 o vaya a la sala de emergencias.  Nmeros de bper  - Dr. Bary Likes: [phone]  - Dra. Annette Barters: [phone]  - Dr. Felipe Horton: [phone]   En caso de inclemencias del tiempo, por favor llame a Lajuan Pila principal al  [phone] para una actualizacin sobre el [place] de cualquier retraso o cierre.  Consejos para la medicacin en dermatologa: Por favor, guarde las cajas en las que vienen los medicamentos de uso tpico para ayudarle a seguir las instrucciones sobre dnde y cmo usarlos. Las farmacias generalmente imprimen las instrucciones del medicamento slo en las cajas y no directamente en los tubos del [place].   Si su medicamento es muy caro, por favor, pngase en contacto con Bettyjane Brunet llamando al [phone] y presione la opcin 4 o envenos un mensaje a travs de Clinical cytogeneticist.   No podemos decirle cul ser su copago por los medicamentos por adelantado ya que esto es diferente dependiendo de la cobertura de su seguro. Sin embargo, es posible que podamos encontrar un medicamento sustituto a Audiological scientist un formulario para que el seguro cubra el medicamento que se considera necesario.   Si se requiere una autorizacin previa para que su compaa de seguros [place] su medicamento, por favor permtanos de 1 a 2 das hbiles para completar este proceso.  Los precios de los medicamentos varan con frecuencia dependiendo del Environmental consultant de dnde se surte la receta y alguna farmacias pueden ofrecer precios ms baratos.  El sitio web www.goodrx.com tiene cupones para medicamentos de Health and safety inspector. Los precios aqu no tienen en cuenta lo que podra costar con la ayuda del seguro (puede ser ms barato con su seguro), pero el sitio web puede darle el precio si no utiliz Tourist information centre manager.  - Puede imprimir el cupn correspondiente y llevarlo con su receta a la farmacia.  - Tambin puede pasar por nuestra oficina durante el horario de atencin regular y Education officer, museum una tarjeta de cupones de GoodRx.  - Si necesita que su receta se enve electrnicamente a una farmacia diferente, informe a nuestra oficina a travs de MyChart de [place] o por telfono llamando al [phone] y presione la opcin 4.

## 2023-11-29 NOTE — Progress Notes (Signed)
 Follow-Up Visit   Subjective  Emily Dalton is a 53 y.o. female who presents for the following: Skin Cancer Screening and Full Body Skin Exam. Hx of dysplastic nevi. No personal Hx of skin cancer.   C/O irritation at mid back between shoulders. Itches at times. Dur: several months.   Check skin on thighs. C/O chicken skin type appearance.   The patient presents for Total-Body Skin Exam (TBSE) for skin cancer screening and mole check. The patient has spots, moles and lesions to be evaluated, some may be new or changing and the patient may have concern these could be cancer.  The following portions of the chart were reviewed this encounter and updated as appropriate: medications, allergies, medical history  Review of Systems:  No other skin or systemic complaints except as noted in HPI or Assessment and Plan.  Objective  Well appearing patient in no apparent distress; mood and affect are within normal limits.  A full examination was performed including scalp, head, eyes, ears, nose, lips, neck, chest, axillae, abdomen, back, buttocks, bilateral upper extremities, bilateral lower extremities, hands, feet, fingers, toes, fingernails, and toenails. All findings within normal limits unless otherwise noted below.   Relevant physical exam findings are noted in the Assessment and Plan.  L medial scapula x7 (7) Erythematous keratotic or waxy stuck-on papule or plaque.  Assessment & Plan   SKIN CANCER SCREENING PERFORMED TODAY.  HISTORY OF DYSPLASTIC NEVI No evidence of recurrence today Recommend regular full body skin exams Recommend daily broad spectrum sunscreen SPF 30+ to sun-exposed areas, reapply every 2 hours as needed.  Call if any new or changing lesions are noted between office visits  ACTINIC DAMAGE - Chronic condition, secondary to cumulative UV/sun exposure - diffuse scaly erythematous macules with underlying dyspigmentation - Recommend daily broad spectrum sunscreen SPF  30+ to sun-exposed areas, reapply every 2 hours as needed.  - Staying in the shade or wearing long sleeves, sun glasses (UVA+UVB protection) and wide brim hats (4-inch brim around the entire circumference of the hat) are also recommended for sun protection.  - Call for new or changing lesions.  LENTIGINES, SEBORRHEIC KERATOSES, HEMANGIOMAS - Benign normal skin lesions - Benign-appearing - Call for any changes  MELANOCYTIC NEVI - Tan-brown and/or pink-flesh-colored symmetric macules and papules - Benign appearing on exam today - Observation - Call clinic for new or changing moles - Recommend daily use of broad spectrum spf 30+ sunscreen to sun-exposed areas.   KERATOSIS PILARIS - Tiny follicular keratotic papules - Benign. Genetic in nature. No cure. - Observe. - If desired, patient can use an emollient (moisturizer) containing ammonium lactate (AmLactin), urea or salicylic acid once a day to smooth the area  Recommend starting moisturizer with exfoliant (Urea, Salicylic acid, or Lactic acid) one to two times daily to help smooth rough and bumpy skin.  OTC options include Cetaphil Rough and Bumpy lotion (Urea), Eucerin Roughness Relief lotion or spot treatment cream (Urea), CeraVe SA lotion/cream for Rough and Bumpy skin (Sal Acid), Gold Bond Rough and Bumpy cream (Sal Acid), and AmLactin 12% lotion/cream (Lactic Acid).  If applying in morning, also apply sunscreen to sun-exposed areas, since these exfoliating moisturizers can increase sensitivity to sun.    INFLAMED SEBORRHEIC KERATOSIS (7) L medial scapula x7 (7) Symptomatic, irritating, patient would like treated. Destruction of lesion - L medial scapula x7 (7) Complexity: simple   Destruction method: cryotherapy   Informed consent: discussed and consent obtained   Timeout:  patient name, date of  birth, surgical site, and procedure verified Lesion destroyed using liquid nitrogen: Yes   Region frozen until ice ball extended beyond  lesion: Yes   Outcome: patient tolerated procedure well with no complications   Post-procedure details: wound care instructions given   Additional details:  Prior to procedure, discussed risks of blister formation, small wound, skin dyspigmentation, or rare scar following cryotherapy. Recommend Vaseline ointment to treated areas while healing.   Return in about 1 year (around 11/28/2024) for TBSE, HxDN.  I, Jill Parcell, CMA, am acting as scribe for Celine Collard, MD.   Documentation: I have reviewed the above documentation for accuracy and completeness, and I agree with the above.  Celine Collard, MD

## 2024-01-04 ENCOUNTER — Ambulatory Visit: Admitting: Dermatology

## 2024-01-19 DIAGNOSIS — M542 Cervicalgia: Secondary | ICD-10-CM | POA: Diagnosis not present

## 2024-03-28 ENCOUNTER — Encounter: Admitting: Internal Medicine

## 2024-03-28 NOTE — Progress Notes (Deleted)
 Subjective:    Patient ID: Emily Dalton, female    DOB: 28-Oct-1970, 53 y.o.   MRN: [id number]  HPI  Patient presents to clinic today for her annual exam.  Flu: 03/2019 Tetanus: 01/2021 COVID: never Shingrix: never Pap smear: hysterectomy Mammogram: 04/2023 Colon screening: 02/2021 Vision screening: annually Dentist: biannually  Diet: She does eat meat. She consumes fruits and veggies. She tries to avoid fried foods. She drinks mostly water, coffee, smoothies. Exercise: weights  Review of Systems     Past Medical History:  Diagnosis Date   Chicken pox    Dysplastic nevus 02/28/2020   left low back paraspinal, excised 04/30/20   Dysplastic nevus 05/07/2020   right umbilicus, right shoulder, right breast, right epigastric - mild    Dysplastic nevus 12/05/2020   left mid side, moderate atypia    Dysplastic nevus 12/05/2020   left anterior mid to distal thigh, severe atypia, close to margin   Migraines     Current Outpatient Medications  Medication Sig Dispense Refill   acetaminophen (TYLENOL) 500 MG tablet Take 500 mg by mouth as needed for headache.     Cholecalciferol (VITAMIN D -3) 25 MCG (1000 UT) CAPS Take by mouth.     clindamycin  (CLEOCIN -T) 1 % lotion Apply to aa's chest QHS. 60 mL 0   ibuprofen (ADVIL,MOTRIN) 200 MG tablet Take 800 mg by mouth as needed for headache.      Magnesium 400 MG CAPS Take 1 capsule by mouth daily.     rizatriptan  (MAXALT ) 10 MG tablet TAKE 1 TABLET BY MOUTH 3 TIMES A DAY AS NEEDED FOR MIGRAINE 10 tablet 5   topiramate  (TOPAMAX ) 50 MG tablet Take 1 tablet (50 mg total) by mouth daily. 90 tablet 3   vitamin B-12 (CYANOCOBALAMIN) 1000 MCG tablet Take 1,000 mcg by mouth daily.     zinc gluconate 50 MG tablet Take 50 mg by mouth daily.     No current facility-administered medications for this visit.    Allergies  Allergen Reactions   Morphine And Codeine Other (See Comments)    Many years ago--does not remember     Family History   Problem Relation Age of Onset   Hypertension Mother    Diabetes Mother    Arthritis Father    Asthma Sister    Hypertension Maternal Grandmother    Hypertension Maternal Grandfather    Cancer Paternal Grandfather        Colon Cancer?   Hypertension Maternal Uncle    Breast cancer Neg Hx     Social History   Socioeconomic History   Marital status: Married    Spouse name: Not on file   Number of children: Not on file   Years of education: Not on file   Highest education level: Not on file  Occupational History   Not on file  Tobacco Use   Smoking status: Former    Types: E-cigarettes   Smokeless tobacco: Never   Tobacco comments:    01/2019  Vaping Use   Vaping status: Former  Substance and Sexual Activity   Alcohol use: No    Alcohol/week: 0.0 standard drinks of alcohol   Drug use: No   Sexual activity: Yes  Other Topics Concern   Not on file  Social History Narrative   Not on file   Social Drivers of Health   Financial Resource Strain: Not on file  Food Insecurity: Not on file  Transportation Needs: Not on file  Physical Activity: Not on  file  Stress: Not on file  Social Connections: Not on file  Intimate Partner Violence: Not on file     Constitutional: Patient reports intermittent headaches.  Denies fever, malaise, fatigue, or abrupt weight changes.  HEENT: Denies eye pain, eye redness, ear pain, ringing in the ears, wax buildup, runny nose, nasal congestion, bloody nose, or sore throat. Respiratory: Denies difficulty breathing, shortness of breath, cough or sputum production.   Cardiovascular: Denies chest pain, chest tightness, palpitations or swelling in the hands or feet.  Gastrointestinal: Patient reports constipation.  Denies abdominal pain, bloating, diarrhea or blood in the stool.  GU: Denies urgency, frequency, pain with urination, burning sensation, blood in urine, odor or discharge. Musculoskeletal: Denies decrease in range of motion,  difficulty with gait, muscle pain or joint pain and swelling.  Skin: Denies redness, rashes, lesions or ulcercations.  Neurological: Denies dizziness, difficulty with memory, difficulty with speech or problems with balance and coordination.  Psych: Denies anxiety, depression, SI/HI.  No other specific complaints in a complete review of systems (except as listed in HPI above).  Objective:   Physical Exam   There were no vitals taken for this visit.  Wt Readings from Last 3 Encounters:  03/17/23 145 lb (65.8 kg)  02/17/22 140 lb (63.5 kg)  10/06/21 143 lb (64.9 kg)    General: Appears her stated age, well developed, well nourished in NAD. Skin: Warm, dry and intact.  Bruise noted to right upper extremity. HEENT: Head: normal shape and size; Eyes: sclera white, no icterus, conjunctiva pink, PERRLA and EOMs intact;  Neck:  Neck supple, trachea midline. No masses, lumps or thyromegaly present.  Cardiovascular: Normal rate and rhythm. S1,S2 noted.  No murmur, rubs or gallops noted. No JVD or BLE edema. No carotid bruits noted. Pulmonary/Chest: Normal effort and positive vesicular breath sounds. No respiratory distress. No wheezes, rales or ronchi noted.  Abdomen: Normal bowel sounds.  Musculoskeletal: Strength 5/5 BUE/BLE.  No difficulty with gait.  Neurological: Alert and oriented. Cranial nerves II-XII grossly intact. Coordination normal.  Psychiatric: Mood and affect normal. Behavior is normal. Judgment and thought content normal.     BMET    Component Value Date/Time   NA 140 03/17/2023 0819   K 4.5 03/17/2023 0819   CL 110 03/17/2023 0819   CO2 24 03/17/2023 0819   GLUCOSE 77 03/17/2023 0819   BUN 17 03/17/2023 0819   CREATININE 0.85 03/17/2023 0819   CALCIUM 8.9 03/17/2023 0819   GFRNONAA 91 01/21/2021 0841   GFRAA 106 01/21/2021 0841    Lipid Panel     Component Value Date/Time   CHOL 194 03/17/2023 0819   TRIG 65 03/17/2023 0819   HDL 71 03/17/2023 0819    CHOLHDL 2.7 03/17/2023 0819   VLDL 14.6 01/15/2020 1545   LDLCALC 108 (H) 03/17/2023 0819    CBC    Component Value Date/Time   WBC 5.5 03/17/2023 0819   RBC 4.28 03/17/2023 0819   HGB 13.0 03/17/2023 0819   HCT 40.2 03/17/2023 0819   PLT 268 03/17/2023 0819   MCV 93.9 03/17/2023 0819   MCH 30.4 03/17/2023 0819   MCHC 32.3 03/17/2023 0819   RDW 12.3 03/17/2023 0819    Hgb A1C Lab Results  Component Value Date   HGBA1C 5.6 03/17/2023           Assessment & Plan:   Preventative health maintenance:  Encouraged her to get a flu shot in the fall Tetanus UTD Encouraged her to get  her COVID-vaccine Discussed Shingrix vaccine, she will check coverage with her insurance company and schedule visit she would like to have this done She no longer needs to screen for cervical cancer Mammogram ordered-she will call to schedule Colon screening UTD Encouraged her to consume a balanced diet and exercise regimen Advised her to see an eye doctor and dentist annually We will check CBC, c-Met, lipid, A1c today  RTC in 6 months, follow-up chronic conditions Angeline Laura, NP

## 2024-05-09 ENCOUNTER — Ambulatory Visit (INDEPENDENT_AMBULATORY_CARE_PROVIDER_SITE_OTHER): Admitting: Internal Medicine

## 2024-05-09 ENCOUNTER — Encounter: Payer: Self-pay | Admitting: Internal Medicine

## 2024-05-09 VITALS — BP 104/80 | HR 65 | Ht 65.0 in | Wt 158.6 lb

## 2024-05-09 DIAGNOSIS — Z0001 Encounter for general adult medical examination with abnormal findings: Secondary | ICD-10-CM

## 2024-05-09 DIAGNOSIS — E663 Overweight: Secondary | ICD-10-CM | POA: Diagnosis not present

## 2024-05-09 DIAGNOSIS — E7800 Pure hypercholesterolemia, unspecified: Secondary | ICD-10-CM

## 2024-05-09 DIAGNOSIS — R739 Hyperglycemia, unspecified: Secondary | ICD-10-CM

## 2024-05-09 DIAGNOSIS — Z6826 Body mass index (BMI) 26.0-26.9, adult: Secondary | ICD-10-CM

## 2024-05-09 LAB — CBC
HCT: 37.7 % (ref 35.0–45.0)
Hemoglobin: 12.5 g/dL (ref 11.7–15.5)
MCH: 31 pg (ref 27.0–33.0)
MCHC: 33.2 g/dL (ref 32.0–36.0)
MCV: 93.5 fL (ref 80.0–100.0)
MPV: 10.4 fL (ref 7.5–12.5)
Platelets: 273 Thousand/uL (ref 140–400)
RBC: 4.03 Million/uL (ref 3.80–5.10)
RDW: 12.2 % (ref 11.0–15.0)
WBC: 6.8 Thousand/uL (ref 3.8–10.8)

## 2024-05-09 LAB — COMPREHENSIVE METABOLIC PANEL WITH GFR
AG Ratio: 2.2 (calc) (ref 1.0–2.5)
ALT: 13 U/L (ref 6–29)
AST: 12 U/L (ref 10–35)
Albumin: 4.1 g/dL (ref 3.6–5.1)
Alkaline phosphatase (APISO): 56 U/L (ref 37–153)
BUN: 19 mg/dL (ref 7–25)
CO2: 27 mmol/L (ref 20–32)
Calcium: 9.2 mg/dL (ref 8.6–10.4)
Chloride: 107 mmol/L (ref 98–110)
Creat: 0.78 mg/dL (ref 0.50–1.03)
Globulin: 1.9 g/dL (ref 1.9–3.7)
Glucose, Bld: 66 mg/dL (ref 65–139)
Potassium: 4.6 mmol/L (ref 3.5–5.3)
Sodium: 140 mmol/L (ref 135–146)
Total Bilirubin: 0.2 mg/dL (ref 0.2–1.2)
Total Protein: 6 g/dL — ABNORMAL LOW (ref 6.1–8.1)
eGFR: 91 mL/min/1.73m2 (ref >=60)

## 2024-05-09 LAB — LIPID PANEL
Cholesterol: 184 mg/dL (ref <200)
HDL: 74 mg/dL (ref >=50)
LDL Cholesterol (Calc): 91 mg/dL
Non-HDL Cholesterol (Calc): 110 mg/dL (ref <130)
Total CHOL/HDL Ratio: 2.5 (calc) (ref <5.0)
Triglycerides: 95 mg/dL (ref <150)

## 2024-05-09 LAB — HEMOGLOBIN A1C
Hgb A1c MFr Bld: 5.2 % (ref <5.7)
Mean Plasma Glucose: 103 mg/dL
eAG (mmol/L): 5.7 mmol/L

## 2024-05-09 MED ORDER — RIZATRIPTAN BENZOATE 10 MG PO TABS: 10.0000 mg | ORAL_TABLET | ORAL | 5 refills | Status: AC | PRN

## 2024-05-09 NOTE — Assessment & Plan Note (Signed)
 Encouraged diet and exercise for weight loss ?

## 2024-05-09 NOTE — Patient Instructions (Signed)

## 2024-05-09 NOTE — Progress Notes (Signed)
 Subjective:    Patient ID: Emily Dalton, female    DOB: 11-02-1970, 53 y.o.   MRN: [id number]  HPI  Patient presents to clinic today for her annual exam.  Flu: 03/2019 Tetanus: 01/2021 COVID: never Shingrix: never Pap smear: hysterectomy Mammogram: 04/2023 Colon screening: 02/2021 Vision screening: annually Dentist: biannually  Diet: She does eat meat. She consumes fruits and veggies. She tries to avoid fried foods. She drinks mostly water, coffee, smoothies. Exercise: weights  Review of Systems     Past Medical History:  Diagnosis Date   Chicken pox    Dysplastic nevus 02/28/2020   left low back paraspinal, excised 04/30/20   Dysplastic nevus 05/07/2020   right umbilicus, right shoulder, right breast, right epigastric - mild    Dysplastic nevus 12/05/2020   left mid side, moderate atypia    Dysplastic nevus 12/05/2020   left anterior mid to distal thigh, severe atypia, close to margin   Migraines     Current Outpatient Medications  Medication Sig Dispense Refill   acetaminophen (TYLENOL) 500 MG tablet Take 500 mg by mouth as needed for headache.     Cholecalciferol (VITAMIN D -3) 25 MCG (1000 UT) CAPS Take by mouth.     clindamycin  (CLEOCIN -T) 1 % lotion Apply to aa's chest QHS. 60 mL 0   ibuprofen (ADVIL,MOTRIN) 200 MG tablet Take 800 mg by mouth as needed for headache.      Magnesium 400 MG CAPS Take 1 capsule by mouth daily.     rizatriptan  (MAXALT ) 10 MG tablet TAKE 1 TABLET BY MOUTH 3 TIMES A DAY AS NEEDED FOR MIGRAINE 10 tablet 5   topiramate  (TOPAMAX ) 50 MG tablet Take 1 tablet (50 mg total) by mouth daily. 90 tablet 3   vitamin B-12 (CYANOCOBALAMIN) 1000 MCG tablet Take 1,000 mcg by mouth daily.     zinc gluconate 50 MG tablet Take 50 mg by mouth daily.     No current facility-administered medications for this visit.    Allergies  Allergen Reactions   Morphine And Codeine Other (See Comments)    Many years ago--does not remember     Family History   Problem Relation Age of Onset   Hypertension Mother    Diabetes Mother    Arthritis Father    Asthma Sister    Hypertension Maternal Grandmother    Hypertension Maternal Grandfather    Cancer Paternal Grandfather        Colon Cancer?   Hypertension Maternal Uncle    Breast cancer Neg Hx     Social History   Socioeconomic History   Marital status: Married    Spouse name: Not on file   Number of children: Not on file   Years of education: Not on file   Highest education level: Not on file  Occupational History   Not on file  Tobacco Use   Smoking status: Former    Types: E-cigarettes   Smokeless tobacco: Never   Tobacco comments:    01/2019  Vaping Use   Vaping status: Former  Substance and Sexual Activity   Alcohol use: No    Alcohol/week: 0.0 standard drinks of alcohol   Drug use: No   Sexual activity: Yes  Other Topics Concern   Not on file  Social History Narrative   Not on file   Social Drivers of Health   Financial Resource Strain: Not on file  Food Insecurity: Not on file  Transportation Needs: Not on file  Physical Activity: Not on  file  Stress: Not on file  Social Connections: Not on file  Intimate Partner Violence: Not on file     Constitutional: Patient reports intermittent headaches.  Denies fever, malaise, fatigue, or abrupt weight changes.  HEENT: Denies eye pain, eye redness, ear pain, ringing in the ears, wax buildup, runny nose, nasal congestion, bloody nose, or sore throat. Respiratory: Denies difficulty breathing, shortness of breath, cough or sputum production.   Cardiovascular: Denies chest pain, chest tightness, palpitations or swelling in the hands or feet.  Gastrointestinal: Patient reports constipation.  Denies abdominal pain, bloating, diarrhea or blood in the stool.  GU: Denies urgency, frequency, pain with urination, burning sensation, blood in urine, odor or discharge. Musculoskeletal: Denies decrease in range of motion,  difficulty with gait, muscle pain or joint pain and swelling.  Skin: Denies redness, rashes, lesions or ulcercations.  Neurological: Denies dizziness, difficulty with memory, difficulty with speech or problems with balance and coordination.  Psych: Denies anxiety, depression, SI/HI.  No other specific complaints in a complete review of systems (except as listed in HPI above).  Objective:   Physical Exam BP 104/80   Pulse 65   Ht 5' 5 (1.651 m)   Wt 158 lb 9.6 oz (71.9 kg)   SpO2 97%   BMI 26.39 kg/m    Wt Readings from Last 3 Encounters:  03/17/23 145 lb (65.8 kg)  02/17/22 140 lb (63.5 kg)  10/06/21 143 lb (64.9 kg)    General: Appears her stated age, well developed, well nourished in NAD. Skin: Warm, dry and intact.   HEENT: Head: normal shape and size; Eyes: sclera white, no icterus, conjunctiva pink, PERRLA and EOMs intact;  Neck:  Neck supple, trachea midline. No masses, lumps or thyromegaly present.  Cardiovascular: Normal rate and rhythm. S1,S2 noted.  No murmur, rubs or gallops noted. No JVD or BLE edema. No carotid bruits noted. Pulmonary/Chest: Normal effort and positive vesicular breath sounds. No respiratory distress. No wheezes, rales or ronchi noted.  Abdomen: Normal bowel sounds.  Musculoskeletal: Strength 5/5 BUE/BLE.  No difficulty with gait.  Neurological: Alert and oriented. Cranial nerves II-XII grossly intact. Coordination normal.  Psychiatric: Mood and affect normal. Behavior is normal. Judgment and thought content normal.     BMET    Component Value Date/Time   NA 140 03/17/2023 0819   K 4.5 03/17/2023 0819   CL 110 03/17/2023 0819   CO2 24 03/17/2023 0819   GLUCOSE 77 03/17/2023 0819   BUN 17 03/17/2023 0819   CREATININE 0.85 03/17/2023 0819   CALCIUM 8.9 03/17/2023 0819   GFRNONAA 91 01/21/2021 0841   GFRAA 106 01/21/2021 0841    Lipid Panel     Component Value Date/Time   CHOL 194 03/17/2023 0819   TRIG 65 03/17/2023 0819   HDL 71  03/17/2023 0819   CHOLHDL 2.7 03/17/2023 0819   VLDL 14.6 01/15/2020 1545   LDLCALC 108 (H) 03/17/2023 0819    CBC    Component Value Date/Time   WBC 5.5 03/17/2023 0819   RBC 4.28 03/17/2023 0819   HGB 13.0 03/17/2023 0819   HCT 40.2 03/17/2023 0819   PLT 268 03/17/2023 0819   MCV 93.9 03/17/2023 0819   MCH 30.4 03/17/2023 0819   MCHC 32.3 03/17/2023 0819   RDW 12.3 03/17/2023 0819    Hgb A1C Lab Results  Component Value Date   HGBA1C 5.6 03/17/2023           Assessment & Plan:   Preventative health  maintenance:  Flu shot declined Tetanus UTD Encouraged her to get her COVID-vaccine Discussed Shingrix vaccine, she will check coverage with her insurance company and schedule visit she would like to have this done She no longer needs to screen for cervical cancer Mammogram declined Colon screening UTD Encouraged her to consume a balanced diet and exercise regimen Advised her to see an eye doctor and dentist annually We will check CBC, c-Met, lipid, A1c today  RTC in 6 months, follow-up chronic conditions Angeline Laura, NP

## 2024-05-10 ENCOUNTER — Ambulatory Visit: Payer: Self-pay | Admitting: Internal Medicine

## 2024-12-05 ENCOUNTER — Ambulatory Visit: Admitting: Dermatology

## 2024-12-12 ENCOUNTER — Ambulatory Visit: Admitting: Dermatology

## 2025-05-15 ENCOUNTER — Encounter: Admitting: Internal Medicine
